# Patient Record
Sex: Female | Born: 1970 | Race: White | Hispanic: No | Marital: Single | Smoking: Current every day smoker
Health system: Southern US, Community
[De-identification: ages and names within clinical notes are randomized; demographics above are authoritative.]

## PROBLEM LIST (undated history)

## (undated) DIAGNOSIS — J45909 Unspecified asthma, uncomplicated: Secondary | ICD-10-CM

## (undated) DIAGNOSIS — I639 Cerebral infarction, unspecified: Secondary | ICD-10-CM

## (undated) DIAGNOSIS — C801 Malignant (primary) neoplasm, unspecified: Secondary | ICD-10-CM

## (undated) DIAGNOSIS — G459 Transient cerebral ischemic attack, unspecified: Secondary | ICD-10-CM

## (undated) DIAGNOSIS — R55 Syncope and collapse: Secondary | ICD-10-CM

## (undated) DIAGNOSIS — I456 Pre-excitation syndrome: Secondary | ICD-10-CM

## (undated) HISTORY — DX: Malignant (primary) neoplasm, unspecified: C80.1

## (undated) HISTORY — DX: Cerebral infarction, unspecified: I63.9

## (undated) HISTORY — DX: Transient cerebral ischemic attack, unspecified: G45.9

## (undated) HISTORY — DX: Syncope and collapse: R55

## (undated) HISTORY — DX: Pre-excitation syndrome: I45.6

## (undated) HISTORY — DX: Unspecified asthma, uncomplicated: J45.909

---
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
## 2008-03-05 ENCOUNTER — Emergency Department: Payer: Self-pay | Admitting: Emergency Medicine

## 2008-06-12 ENCOUNTER — Emergency Department: Payer: Self-pay | Admitting: Emergency Medicine

## 2010-06-28 ENCOUNTER — Emergency Department: Payer: Self-pay | Admitting: Unknown Physician Specialty

## 2010-10-03 ENCOUNTER — Emergency Department: Payer: Self-pay | Admitting: Unknown Physician Specialty

## 2010-12-05 ENCOUNTER — Emergency Department: Payer: Self-pay | Admitting: Emergency Medicine

## 2012-12-24 ENCOUNTER — Emergency Department: Payer: Self-pay | Admitting: Emergency Medicine

## 2012-12-29 ENCOUNTER — Encounter: Payer: Self-pay | Admitting: Internal Medicine

## 2012-12-29 ENCOUNTER — Emergency Department: Payer: Self-pay | Admitting: Emergency Medicine

## 2012-12-29 LAB — TROPONIN I
Troponin-I: <0.02 ng/mL
Troponin-I: <0.02 ng/mL

## 2012-12-29 LAB — CBC WITH DIFFERENTIAL/PLATELET
Basophil #: 0.1 10*3/uL (ref 0.0–0.1)
Basophil %: 1.1 %
Eosinophil #: 0.3 10*3/uL (ref 0.0–0.7)
Eosinophil %: 2 %
HCT: 39.1 % (ref 35.0–47.0)
HGB: 13.2 g/dL (ref 12.0–16.0)
Lymphocyte #: 3.2 10*3/uL (ref 1.0–3.6)
Lymphocyte %: 25 %
MCH: 28.1 pg (ref 26.0–34.0)
MCHC: 33.8 g/dL (ref 32.0–36.0)
MCV: 83 fL (ref 80–100)
Monocyte #: 0.8 x10 3/mm (ref 0.2–0.9)
Monocyte %: 6.3 %
Neutrophil #: 8.5 10*3/uL — ABNORMAL HIGH (ref 1.4–6.5)
Neutrophil %: 65.6 %
Platelet: 333 10*3/uL (ref 150–440)
RBC: 4.71 10*6/uL (ref 3.80–5.20)
RDW: 16.2 % — ABNORMAL HIGH (ref 11.5–14.5)
WBC: 12.9 10*3/uL — ABNORMAL HIGH (ref 3.6–11.0)

## 2012-12-29 LAB — TSH: Thyroid Stimulating Horm: 1.43 u[IU]/mL

## 2012-12-29 LAB — COMPREHENSIVE METABOLIC PANEL
Albumin: 3.7 g/dL (ref 3.4–5.0)
Alkaline Phosphatase: 71 U/L (ref 50–136)
Anion Gap: 5 — ABNORMAL LOW (ref 7–16)
BUN: 9 mg/dL (ref 7–18)
Bilirubin,Total: 0.4 mg/dL (ref 0.2–1.0)
Calcium, Total: 8.9 mg/dL (ref 8.5–10.1)
Chloride: 110 mmol/L — ABNORMAL HIGH (ref 98–107)
Co2: 23 mmol/L (ref 21–32)
Creatinine: 0.6 mg/dL (ref 0.60–1.30)
EGFR (African American): >60
EGFR (Non-African Amer.): >60
Glucose: 86 mg/dL (ref 65–99)
Osmolality: 274 (ref 275–301)
Potassium: 4.1 mmol/L (ref 3.5–5.1)
SGOT(AST): 31 U/L (ref 15–37)
SGPT (ALT): 19 U/L (ref 12–78)
Sodium: 138 mmol/L (ref 136–145)
Total Protein: 7.2 g/dL (ref 6.4–8.2)

## 2012-12-31 ENCOUNTER — Ambulatory Visit (INDEPENDENT_AMBULATORY_CARE_PROVIDER_SITE_OTHER): Payer: Self-pay | Admitting: Internal Medicine

## 2012-12-31 ENCOUNTER — Encounter: Payer: Self-pay | Admitting: Internal Medicine

## 2012-12-31 VITALS — BP 118/82 | HR 56 | Ht 63.0 in | Wt 140.0 lb

## 2012-12-31 DIAGNOSIS — R002 Palpitations: Secondary | ICD-10-CM

## 2012-12-31 DIAGNOSIS — I456 Pre-excitation syndrome: Secondary | ICD-10-CM

## 2012-12-31 DIAGNOSIS — R079 Chest pain, unspecified: Secondary | ICD-10-CM

## 2012-12-31 HISTORY — DX: Pre-excitation syndrome: I45.6

## 2012-12-31 NOTE — Assessment & Plan Note (Signed)
The patient has ventricular preexcitation. She has had tachycardia palpitations. Interestingly, by report, vital signs were normal on arrival of EMS raising the question as to whether this was a panic attack. She has also had subsequent episodes however,  Of tachypalpitations. We'll undertake an event recorder to try and clarify the mechanism of her palpitations. We have discussed the mechanism of WPW and SVT.  We'll obtain an echo because of right-sided pathways can be associated with Epstein's anomaly.

## 2012-12-31 NOTE — Progress Notes (Signed)
ELECTROPHYSIOLOGY CONSULT NOTE  Patient ID: Alexandra Stevenson, MRN: [id number], DOB/AGE: Aug 21, 1970 42 y.o. Admit date: (Not on file) Date of Consult: 12/31/2012  Primary Physician: No PCP Per Patient Primary Cardiologist:   Chief Complaint:    HPI Alexandra Stevenson is a 42 y.o. female seen at the request of the emergency room with a past medical history notable for question strokes who presented to the ER the EMS because of symptoms of palpitations associated with chest pain lightheadedness diaphoresis and flushing. She ruled  out for myocardial infarction. Her ECG was notable for ventricular preexcitation and she was referred. She has had no prior episodes. However, since that time 48 hours ago she has had a number of episodes with abrupt onset and offset of palpitations associated with some lightheadedness and she's also had some vague chest discomfort.  The information related to her prior stroke history is not available.  She describes herself as a recovering addict. She uses alcohol rarely; she smokes uses no recreational drugs. She is the mother of 4. Her children live in [place]. She does see them. Past Medical History  Diagnosis Date  . TIA (transient ischemic attack)     x 3  . Asthma   . Syncope and collapse   . Cancer 1990    cervical  . Stroke     x3      Surgical History:  Past Surgical History  Procedure Laterality Date  . Cervical biopsy  1990     Home Meds: Prior to Admission medications   Not on File     Allergies: No Known Allergies  History   Social History  . Marital Status: Single    Spouse Name: N/A    Number of Children: N/A  . Years of Education: N/A   Occupational History  . Not on file.   Social History Main Topics  . Smoking status: Current Every Day Smoker -- 0.50 packs/day for 30 years    Types: Cigarettes  . Smokeless tobacco: Not on file  . Alcohol Use: Yes     Comment: occasional  . Drug Use: No     Comment: past  . Sexually  Active: Not on file   Other Topics Concern  . Not on file   Social History Narrative  . No narrative on file     History reviewed. No pertinent family history.   ROS:  Please see the history of present illness.   f  All other systems reviewed and negative.    Physical Exam:   Blood pressure 118/82, pulse 56, height 5\' 3"  (1.6 m), weight 140 lb (63.504 kg). General: Well developed, well nourished female in no acute distress. Head: Normocephalic, atraumatic, sclera non-icteric, no xanthomas, nares are without discharge. EENT:poor dentition Lymph Nodes:  none Back: without scoliosis/kyphosis , no CVA tendersness Neck: Negative for carotid bruits. JVD not elevated. Lungs: Clear bilaterally to auscultation without wheezes, rales, or rhonchi. Breathing is unlabored. Heart: RRR with quiet  Call homeS1 S2. No  murmur , rubs, or gallops appreciated. Abdomen: Soft, non-tender, non-distended with normoactive bowel sounds. No hepatomegaly. No rebound/guarding. No obvious abdominal masses. Msk:  Strength and tone appear normal for age. Extremities: No clubbing or cyanosis. N  edema.  Distal pedal pulses are 2+ and equal bilaterally. Skin: Warm and Dry broadly tatooed Neuro: Alert and oriented X 3. CN III-XII intact Grossly normal sensory and motor function . Psych:  Responds to questions appropriately with a normal affect.  Labs:  Radiology/Studies:  No results found.  EKG: sinus rhythm with evidence of ventricular preexcitation heart rate was 56 Delta waves were positive with a transition V2-V3 most consistent with the right lateral accessory pathway  Assessment and Plan:    Sherryl Manges

## 2012-12-31 NOTE — Patient Instructions (Addendum)
Your physician has requested that you have an echocardiogram. Echocardiography is a painless test that uses sound waves to create images of your heart. It provides your doctor with information about the size and shape of your heart and how well your heart's chambers and valves are working. This procedure takes approximately one hour. There are no restrictions for this procedure.  Your physician has recommended that you wear an event monitor. Event monitors are medical devices that record the heart's electrical activity. Doctors most often [place] these monitors to diagnose arrhythmias. Arrhythmias are problems with the speed or rhythm of the heartbeat. The monitor is a small, portable device. You can wear one while you do your normal daily activities. This is usually used to diagnose what is causing palpitations/syncope (passing out).  Your physician recommends that you schedule a follow-up appointment in: 4-5 weeks with Dr. Graciela Husbands.

## 2013-01-08 ENCOUNTER — Other Ambulatory Visit (INDEPENDENT_AMBULATORY_CARE_PROVIDER_SITE_OTHER): Payer: Self-pay

## 2013-01-08 ENCOUNTER — Other Ambulatory Visit: Payer: Self-pay

## 2013-01-08 DIAGNOSIS — R002 Palpitations: Secondary | ICD-10-CM

## 2013-01-08 DIAGNOSIS — I456 Pre-excitation syndrome: Secondary | ICD-10-CM

## 2013-01-13 DIAGNOSIS — R002 Palpitations: Secondary | ICD-10-CM

## 2013-01-13 DIAGNOSIS — I456 Pre-excitation syndrome: Secondary | ICD-10-CM

## 2013-02-10 ENCOUNTER — Telehealth: Payer: Self-pay

## 2013-02-10 NOTE — Telephone Encounter (Signed)
Please advise ecardio tracings on your desk for review

## 2013-02-10 NOTE — Telephone Encounter (Signed)
Pt calling with issues ZO:[id number] monitor  Says the wires were "messing up" etc so she had to call company several times to get new wires/battery shipped to her Has not worn monitor since 6/22. Last day for monitor is tomm She asks if ok to go ahead and send this back, asks if we have enough info After reviewing monitor it appears we have enough but i will check with Dr. Kirke Corin first Says she is still having occasional CP

## 2013-02-11 NOTE — Telephone Encounter (Signed)
Please advise Can fax tracings if I need to

## 2013-02-11 NOTE — Telephone Encounter (Signed)
A patient of Dr. Graciela Husbands.

## 2013-02-12 ENCOUNTER — Observation Stay: Payer: Self-pay | Admitting: Family Medicine

## 2013-02-12 LAB — CK TOTAL AND CKMB (NOT AT ARMC)
CK, Total: 129 U/L (ref 21–215)
CK, Total: 86 U/L (ref 21–215)
CK-MB: 1.5 ng/mL (ref 0.5–3.6)
CK-MB: 1.6 ng/mL (ref 0.5–3.6)

## 2013-02-12 LAB — URINALYSIS, COMPLETE
Bacteria: NONE SEEN
Bilirubin,UR: NEGATIVE
Glucose,UR: NEGATIVE mg/dL (ref 0–75)
Ketone: NEGATIVE
Leukocyte Esterase: NEGATIVE
Nitrite: NEGATIVE
Ph: 7 (ref 4.5–8.0)
Protein: NEGATIVE
RBC,UR: 1 /HPF (ref 0–5)
Specific Gravity: 1.005 (ref 1.003–1.030)
Squamous Epithelial: 1
WBC UR: <1 /HPF (ref 0–5)

## 2013-02-12 LAB — COMPREHENSIVE METABOLIC PANEL
Albumin: 3.9 g/dL (ref 3.4–5.0)
Alkaline Phosphatase: 75 U/L (ref 50–136)
Anion Gap: 14 (ref 7–16)
BUN: 4 mg/dL — ABNORMAL LOW (ref 7–18)
Bilirubin,Total: 0.5 mg/dL (ref 0.2–1.0)
Calcium, Total: 9 mg/dL (ref 8.5–10.1)
Chloride: 111 mmol/L — ABNORMAL HIGH (ref 98–107)
Co2: 18 mmol/L — ABNORMAL LOW (ref 21–32)
Creatinine: 0.71 mg/dL (ref 0.60–1.30)
EGFR (African American): >60
EGFR (Non-African Amer.): >60
Glucose: 87 mg/dL (ref 65–99)
Osmolality: 281 (ref 275–301)
Potassium: 3.4 mmol/L — ABNORMAL LOW (ref 3.5–5.1)
SGOT(AST): 17 U/L (ref 15–37)
SGPT (ALT): 20 U/L (ref 12–78)
Sodium: 143 mmol/L (ref 136–145)
Total Protein: 7.2 g/dL (ref 6.4–8.2)

## 2013-02-12 LAB — ETHANOL
Ethanol %: 0.101 % — ABNORMAL HIGH (ref 0.000–0.080)
Ethanol: 101 mg/dL

## 2013-02-12 LAB — TROPONIN I
Troponin-I: <0.02 ng/mL
Troponin-I: <0.02 ng/mL

## 2013-02-12 LAB — CBC
HCT: 37.9 % (ref 35.0–47.0)
HGB: 12.7 g/dL (ref 12.0–16.0)
MCH: 27.9 pg (ref 26.0–34.0)
MCHC: 33.5 g/dL (ref 32.0–36.0)
MCV: 83 fL (ref 80–100)
Platelet: 357 10*3/uL (ref 150–440)
RBC: 4.55 10*6/uL (ref 3.80–5.20)
RDW: 15.8 % — ABNORMAL HIGH (ref 11.5–14.5)
WBC: 24.2 10*3/uL — ABNORMAL HIGH (ref 3.6–11.0)

## 2013-02-12 LAB — PROTIME-INR
INR: 1
Prothrombin Time: 13.2 secs (ref 11.5–14.7)

## 2013-02-12 LAB — DRUG SCREEN, URINE
Amphetamines, Ur Screen: NEGATIVE (ref ?–1000)
Barbiturates, Ur Screen: NEGATIVE (ref ?–200)
Benzodiazepine, Ur Scrn: NEGATIVE (ref ?–200)
Cannabinoid 50 Ng, Ur ~~LOC~~: POSITIVE (ref ?–50)
Cocaine Metabolite,Ur ~~LOC~~: POSITIVE (ref ?–300)
MDMA (Ecstasy)Ur Screen: NEGATIVE (ref ?–500)
Methadone, Ur Screen: NEGATIVE (ref ?–300)
Opiate, Ur Screen: NEGATIVE (ref ?–300)
Phencyclidine (PCP) Ur S: NEGATIVE (ref ?–25)
Tricyclic, Ur Screen: NEGATIVE (ref ?–1000)

## 2013-02-12 LAB — PHOSPHORUS: Phosphorus: 1.9 mg/dL — ABNORMAL LOW (ref 2.5–4.9)

## 2013-02-12 LAB — PREGNANCY, URINE: Pregnancy Test, Urine: NEGATIVE m[IU]/mL

## 2013-02-12 LAB — MAGNESIUM: Magnesium: 1.8 mg/dL

## 2013-02-13 LAB — HCG, QUANTITATIVE, PREGNANCY: Beta Hcg, Quant.: <1 m[IU]/mL — ABNORMAL LOW

## 2013-02-13 LAB — LIPID PANEL
Cholesterol: 81 mg/dL (ref 0–200)
HDL Cholesterol: 31 mg/dL — ABNORMAL LOW (ref 40–60)
Ldl Cholesterol, Calc: 35 mg/dL (ref 0–100)
Triglycerides: 73 mg/dL (ref 0–200)
VLDL Cholesterol, Calc: 15 mg/dL (ref 5–40)

## 2013-02-13 LAB — TROPONIN I: Troponin-I: <0.02 ng/mL

## 2013-02-13 LAB — CK TOTAL AND CKMB (NOT AT ARMC)
CK, Total: 75 U/L (ref 21–215)
CK-MB: 1.1 ng/mL (ref 0.5–3.6)

## 2013-02-22 ENCOUNTER — Ambulatory Visit (INDEPENDENT_AMBULATORY_CARE_PROVIDER_SITE_OTHER): Payer: Self-pay | Admitting: Internal Medicine

## 2013-02-22 ENCOUNTER — Encounter: Payer: Self-pay | Admitting: Internal Medicine

## 2013-02-22 VITALS — BP 122/88 | HR 64 | Ht 63.0 in | Wt 138.0 lb

## 2013-02-22 DIAGNOSIS — R079 Chest pain, unspecified: Secondary | ICD-10-CM

## 2013-02-22 DIAGNOSIS — R5381 Other malaise: Secondary | ICD-10-CM

## 2013-02-22 DIAGNOSIS — D72829 Elevated white blood cell count, unspecified: Secondary | ICD-10-CM

## 2013-02-22 DIAGNOSIS — R5383 Other fatigue: Secondary | ICD-10-CM

## 2013-02-22 DIAGNOSIS — I456 Pre-excitation syndrome: Secondary | ICD-10-CM

## 2013-02-22 NOTE — Assessment & Plan Note (Signed)
I am not sure of the cause of this. Reviewing her laboratories demonstrates a low phosphorus low potassium a markedly elevated white blood count. Recheck the latter with a differential

## 2013-02-22 NOTE — Assessment & Plan Note (Signed)
Asymptomatic preexcitation; we will not pursue further treatment at this time. She is advised that is she has tachypalpitations to seek medical attention and i would be glad to see her after that. I explained that it is not contributing to her fatigue

## 2013-02-22 NOTE — Progress Notes (Signed)
Patient Care Team: No Pcp Per Patient as PCP - General (General Practice)   HPI  Alexandra Stevenson is a 42 y.o. female Seen in followup for WPW with some palpitations. Echocardiogram demonstrated normal left ventricular function and no AV valvular issues. She's recently been hospitalized for chest pain associated with cocaine and other substances.  She had while wearing her 30 day monitor multiple episodes of flutter. No arrhythmia was noted.  Her other major complaint is fatigue. Outside hospital records were reviewed. Lipids were normal UA was normal white count was markedly elevated ; her potassium was somewhat low as was her phosphorus. CT scan was normal for pulmonary embolism.  Past Medical History  Diagnosis Date  . TIA (transient ischemic attack)     x 3  . Asthma   . Syncope and collapse   . Cancer 1990    cervical  . Stroke     x3  . WPW (Wolff-Parkinson-White syndrome) 12/31/2012    Past Surgical History  Procedure Laterality Date  . Cervical biopsy  1990    Current Outpatient Prescriptions  Medication Sig Dispense Refill  . ibuprofen (ADVIL,MOTRIN) 200 MG tablet Take 200 mg by mouth every 6 (six) hours as needed for pain.       No current facility-administered medications for this visit.    Allergies  Allergen Reactions  . No Known Allergies     Review of Systems negative except from HPI and PMH  Physical Exam BP 122/88  Pulse 64  Ht 5\' 3"  (1.6 m)  Wt 138 lb (62.596 kg)  BMI 24.45 kg/m2 Well developed and well nourished in no acute distress HENT normal E scleral and icterus clear Neck Supple JVP flat; carotids brisk and full Clear to ausculation Regular rate and rhythm, no murmurs gallops or rub Soft with active bowel sounds No clubbing cyanosis none Edema Alert and oriented, grossly normal motor and sensory function Skin Warm and Dry    Assessment and  Plan

## 2013-02-22 NOTE — Patient Instructions (Addendum)
Your physician wants you to follow-up in: 6 months You will receive a reminder letter in the mail two months in advance. If you don't receive a letter, please call our office to schedule the follow-up appointment.  Labs today CBC and BMET  Your physician has requested that you have a lexiscan myoview. For further information please visit [email or web address] Please follow instruction sheet, as given.  [facility] MYOVIEW  Your caregiver has ordered a Stress Test with nuclear imaging. The purpose of this test is to evaluate the blood supply to your heart muscle. This procedure is referred to as a "Non-Invasive Stress Test." This is because other than having an IV started in your vein, nothing is inserted or "invades" your body. Cardiac stress tests are done to find areas of poor blood flow to the heart by determining the extent of coronary artery disease (CAD). Some patients exercise on a treadmill, which naturally increases the blood flow to your heart, while others who are  unable to walk on a treadmill due to physical limitations have a pharmacologic/chemical stress agent called Lexiscan . This medicine will mimic walking on a treadmill by temporarily increasing your coronary blood flow.   Please note: these test may take anywhere between 2-4 hours to complete  PLEASE REPORT TO [facility]  THE VOLUNTEERS AT THE FIRST DESK WILL DIRECT YOU WHERE TO GO  Date of Procedure:____________Monday July 21st_______________  Arrival Time for Procedure:____________9:30am _____________  PLEASE NOTIFY THE OFFICE AT LEAST 24 HOURS IN ADVANCE IF YOU ARE UNABLE TO KEEP YOUR APPOINTMENT.  [phone]  How to prepare for your Myoview test:  1. Do not eat or drink after midnight 2. No caffeine for 24 hours prior to test 3. No smoking 24 hours prior to test. 4. Your medication may be taken with water.  If your doctor stopped a medication because of this test, do not take that medication. 5. Ladies,  please do not wear dresses.  Skirts or pants are appropriate. Please wear a short sleeve shirt. 6. No perfume, cologne or lotion. 7. Wear comfortable walking shoes. No heels!

## 2013-02-22 NOTE — Assessment & Plan Note (Signed)
As above.

## 2013-02-23 LAB — BASIC METABOLIC PANEL
BUN/Creatinine Ratio: 9 (ref 9–23)
BUN: 7 mg/dL (ref 6–24)
CO2: 18 mmol/L (ref 18–29)
Calcium: 9.3 mg/dL (ref 8.7–10.2)
Chloride: 104 mmol/L (ref 97–108)
Creatinine, Ser: 0.8 mg/dL (ref 0.57–1.00)
GFR calc Af Amer: 104 mL/min/{1.73_m2} (ref >59)
GFR calc non Af Amer: 91 mL/min/{1.73_m2} (ref >59)
Glucose: 87 mg/dL (ref 65–99)
Potassium: 4.6 mmol/L (ref 3.5–5.2)
Sodium: 142 mmol/L (ref 134–144)

## 2013-02-23 LAB — CBC WITH DIFFERENTIAL/PLATELET
Basophils Absolute: 0.1 10*3/uL (ref 0.0–0.2)
Basos: 1 % (ref 0–3)
Eos: 1 % (ref 0–5)
Eosinophils Absolute: 0.2 10*3/uL (ref 0.0–0.4)
HCT: 39 % (ref 34.0–46.6)
Hemoglobin: 12.9 g/dL (ref 11.1–15.9)
Immature Grans (Abs): 0 10*3/uL (ref 0.0–0.1)
Immature Granulocytes: 0 % (ref 0–2)
Lymphocytes Absolute: 3.3 10*3/uL — ABNORMAL HIGH (ref 0.7–3.1)
Lymphs: 21 % (ref 14–46)
MCH: 28.5 pg (ref 26.6–33.0)
MCHC: 33.1 g/dL (ref 31.5–35.7)
MCV: 86 fL (ref 79–97)
Monocytes Absolute: 1.1 10*3/uL — ABNORMAL HIGH (ref 0.1–0.9)
Monocytes: 7 % (ref 4–12)
Neutrophils Absolute: 10.7 10*3/uL — ABNORMAL HIGH (ref 1.4–7.0)
Neutrophils Relative %: 70 % (ref 40–74)
RBC: 4.52 x10E6/uL (ref 3.77–5.28)
RDW: 15.7 % — ABNORMAL HIGH (ref 12.3–15.4)
WBC: 15.3 10*3/uL — ABNORMAL HIGH (ref 3.4–10.8)

## 2013-03-01 ENCOUNTER — Ambulatory Visit: Payer: Self-pay | Admitting: Internal Medicine

## 2013-03-01 ENCOUNTER — Telehealth: Payer: Self-pay | Admitting: *Deleted

## 2013-03-01 DIAGNOSIS — R079 Chest pain, unspecified: Secondary | ICD-10-CM

## 2013-03-01 NOTE — Telephone Encounter (Signed)
Tresa Endo called re: Has the [employer] paperwork for sick leave due to hospital stay and appointment follow up appt.'s been completed?

## 2013-03-02 ENCOUNTER — Other Ambulatory Visit: Payer: Self-pay

## 2013-03-02 DIAGNOSIS — I456 Pre-excitation syndrome: Secondary | ICD-10-CM

## 2013-03-02 DIAGNOSIS — R5383 Other fatigue: Secondary | ICD-10-CM

## 2013-03-02 DIAGNOSIS — R079 Chest pain, unspecified: Secondary | ICD-10-CM

## 2013-03-03 ENCOUNTER — Encounter (INDEPENDENT_AMBULATORY_CARE_PROVIDER_SITE_OTHER): Payer: Self-pay

## 2013-03-03 DIAGNOSIS — I456 Pre-excitation syndrome: Secondary | ICD-10-CM

## 2013-03-03 DIAGNOSIS — R002 Palpitations: Secondary | ICD-10-CM

## 2013-03-03 NOTE — Telephone Encounter (Signed)
Please call [employer] and inquire about pt's FMLA.  Thanks

## 2014-02-09 NOTE — Telephone Encounter (Signed)
This encounter was created in error - please disregard.

## 2014-04-18 ENCOUNTER — Emergency Department: Payer: Self-pay | Admitting: Emergency Medicine

## 2014-04-18 LAB — CBC WITH DIFFERENTIAL/PLATELET
Basophil #: 0.2 10*3/uL — ABNORMAL HIGH (ref 0.0–0.1)
Basophil %: 1.5 %
Eosinophil #: 0.3 10*3/uL (ref 0.0–0.7)
Eosinophil %: 2.3 %
HCT: 35.2 % (ref 35.0–47.0)
HGB: 11 g/dL — ABNORMAL LOW (ref 12.0–16.0)
Lymphocyte #: 3.3 10*3/uL (ref 1.0–3.6)
Lymphocyte %: 25.4 %
MCH: 25.2 pg — ABNORMAL LOW (ref 26.0–34.0)
MCHC: 31.4 g/dL — ABNORMAL LOW (ref 32.0–36.0)
MCV: 81 fL (ref 80–100)
Monocyte #: 0.9 x10 3/mm (ref 0.2–0.9)
Monocyte %: 6.9 %
Neutrophil #: 8.2 10*3/uL — ABNORMAL HIGH (ref 1.4–6.5)
Neutrophil %: 63.9 %
Platelet: 410 10*3/uL (ref 150–440)
RBC: 4.37 10*6/uL (ref 3.80–5.20)
RDW: 18.1 % — ABNORMAL HIGH (ref 11.5–14.5)
WBC: 12.9 10*3/uL — ABNORMAL HIGH (ref 3.6–11.0)

## 2014-04-18 LAB — URINALYSIS, COMPLETE
Bilirubin,UR: NEGATIVE
Glucose,UR: NEGATIVE mg/dL (ref 0–75)
Ketone: NEGATIVE
Leukocyte Esterase: NEGATIVE
Nitrite: NEGATIVE
Ph: 5 (ref 4.5–8.0)
Protein: NEGATIVE
RBC,UR: 99 /HPF (ref 0–5)
Specific Gravity: 1.018 (ref 1.003–1.030)
Squamous Epithelial: 2
WBC UR: 3 /HPF (ref 0–5)

## 2014-04-18 LAB — COMPREHENSIVE METABOLIC PANEL
Albumin: 3.5 g/dL (ref 3.4–5.0)
Alkaline Phosphatase: 61 U/L
Anion Gap: 6 — ABNORMAL LOW (ref 7–16)
BUN: 6 mg/dL — ABNORMAL LOW (ref 7–18)
Bilirubin,Total: 0.2 mg/dL (ref 0.2–1.0)
Calcium, Total: 8.5 mg/dL (ref 8.5–10.1)
Chloride: 111 mmol/L — ABNORMAL HIGH (ref 98–107)
Co2: 22 mmol/L (ref 21–32)
Creatinine: 0.97 mg/dL (ref 0.60–1.30)
EGFR (African American): >60
EGFR (Non-African Amer.): >60
Glucose: 103 mg/dL — ABNORMAL HIGH (ref 65–99)
Osmolality: 275 (ref 275–301)
Potassium: 3.5 mmol/L (ref 3.5–5.1)
SGOT(AST): 13 U/L — ABNORMAL LOW (ref 15–37)
SGPT (ALT): 13 U/L — ABNORMAL LOW
Sodium: 139 mmol/L (ref 136–145)
Total Protein: 7 g/dL (ref 6.4–8.2)

## 2014-04-18 LAB — LIPASE, BLOOD: Lipase: 190 U/L (ref 73–393)

## 2014-04-18 LAB — GC/CHLAMYDIA PROBE AMP

## 2014-04-18 LAB — WET PREP, GENITAL

## 2014-05-02 ENCOUNTER — Telehealth: Payer: Self-pay | Admitting: Family Medicine

## 2014-05-02 NOTE — Telephone Encounter (Signed)
Error

## 2014-12-02 NOTE — Discharge Summary (Signed)
PATIENT NAME:  Alexandra Stevenson, Alexandra Stevenson MR#:  [id number] DATE OF BIRTH:  15-Mar-1971  DATE OF ADMISSION:  02/12/2013 DATE OF DISCHARGE:  02/13/2013  REASON FOR ADMISSION: Chest pain.  DISPOSITION: Home.  MEDICATIONS AT DISCHARGE: Aspirin 325 mg once daily.  FOLLOWUP: Dr. Ida Stevenson in the next 2 to 4 days for a stress test in the office.  DIET:  Low-fat, low-cholesterol.  RECOMMENDATIONS: Stop using cocaine. Take aspirin. Healthy diet and exercise.  IMPORTANT RESULTS:  Potassium 3.4, chloride 111, creatinine 0.71, LCL cholesterol 35, HDL cholesterol 31, alcohol level 0.01.   Troponins were negative x 3. Urinalysis positive for cocaine.  White count of 24,000. D-dimer was negative. Urinalysis was negative. Pregnancy test was negative.   EKG: Normal sinus rhythm.   Chest x-ray: No acute disease of the chest.  CT of the chest to rule out pulmonary embolism:  No evidence of pulmonary embolus. No focal consolidation or pneumothorax.   HOSPITAL COURSE: Alexandra Stevenson is  a very nice 44 year old female who has a history of a previous CVA, Wolff-Parkinson-White syndrome and tobacco abuse as well as drug abuse.  She presented to the ER on 02/12/2013 with chest pain that was sharp in nature, with feeling in the central chest. No radiation, not associated with shortness of breath, diaphoresis, palpitations, or dizziness. She developed symptoms around 6:30 in the morning when she woke up, and she had another episode similar to this 3 months ago.  The patient presented to the ER where she was already pain-free. She did have a history of tobacco abuse. Urine toxicology was positive for cocaine. The patient was admitted for further evaluation.  MEDICAL PROBLEMS: 1.  Chest pain: Likely related to cocaine abuse. The pain was actually reproducible as well during my physical examination. The patient was free of pain, and she had no changes on her EKG. Her troponins were negative. She does have a history of  Wolff-Parkinson-White. There were no signs of supraventricular tachycardia at that point. There were no palpitations, and without any tachyarrhythmias on telemetry monitoring.  The patient was scheduled to have a  Myoview in the morning. She is still cocaine-positive. I spoke with Dr. Rockey Situ. He recommended that she go to the office and get an exercise stress test there as the patient was asymptomatic, with fairly low risk factors.  Recommendation to quit cocaine were given to the patient. Tobacco cessation was given to the patient. As far as her Wolff-Parkinson-White, the patient is to follow up with the [facility], and so far are no signs of excitability of the heart,  no arrhythmias.  The patient agreed with the plan. She says she has not done cocaine in weeks, but as the test was positive we encouraged her to not use cocaine anymore.  I spent about 45 minutes discharging this patient and discussing the case with cardiology.   ____________________________ Lemitar Sink, MD rsg:dm D: 02/14/2013 06:58:20 ET T: 02/14/2013 07:27:13 ET JOB#: [id number]  cc: [place] Sink, MD, <Dictator> Minna Merritts, MD Cristi Loron MD ELECTRONICALLY SIGNED 02/20/2013 16:37

## 2014-12-02 NOTE — H&P (Signed)
PATIENT NAME:  Alexandra Stevenson, Alexandra Stevenson MR#:  [id number] DATE OF BIRTH:  [birth year]  DATE OF ADMISSION:  02/12/2013  PRIMARY CARE PHYSICIAN: Does not have one.  CHIEF COMPLAINT: Chest pain.   HISTORY OF PRESENT ILLNESS: This is a 44 year old female who presents to the hospital due to chest pain. She describes the pain as being sharp in nature, located in the center of her chest, nonradiating, not associated with shortness of breath or diaphoresis or palpitations or any dizziness. She developed the symptoms around 6:30 this morning as it woke her up from sleep. She had another episode of this, similar chest pain, about 3 months ago.   Presently, the patient is chest pain-free and hemodynamically stable. The patient does have a history of long-term tobacco abuse. Also her urine toxicology was positive for cocaine. Hospitalist services were contacted for further treatment and evaluation.   REVIEW OF SYSTEMS: CONSTITUTIONAL: No documented fever. No weight gain or weight loss.  EYES: No blurry or double vision.  ENT: No tinnitus. No postnasal drip. No redness of the oropharynx.  RESPIRATORY: No cough. No wheeze. No hemoptysis.  CARDIOVASCULAR: Positive chest pain. No orthopnea. No palpitations or syncope.  GASTROINTESTINAL: No nausea. No vomiting. No diarrhea. No abdominal pain. No melena or hematochezia.  GENITOURINARY: No dysuria or hematuria.  ENDOCRINE: No polyuria or nocturia. No heat or cold intolerance. HEME:  No anemia. No bruising or bleeding.  INTEGUMENTARY: No rashes. No lesions.  MUSCULOSKELETAL: No arthritis. No swelling. No gout.  NEUROLOGIC: No numbness or tingling. No ataxia. No seizure-type activity.  PSYCHIATRIC: No anxiety.  No insomnia. No ADD.   PAST MEDICAL HISTORY: Is consistent with: 1.  Previous CVA. 2.  Wolff-Parkinson-White syndrome. 3.  Tobacco abuse.   ALLERGIES: No known drug allergies.   SOCIAL HISTORY: Does smoke about 1/2 pack per day, has been smoking for the past 30  years. Occasional alcohol abuse.  Urine toxicology is positive for marijuana and cocaine. She said she last did cocaine about a week or so ago.   FAMILY HISTORY: The patient's mother died from complications of a brain aneurysm. Father is alive, has no health problems.   CURRENT MEDICATIONS: The patient is currently on no medications.   PHYSICAL EXAMINATION:  VITAL SIGNS: Temperature is 98.1, pulse 78, respirations 20, blood pressure 129/62 and sats 98% on room air.  GENERAL: She is a pleasant-appearing female in no apparent distress.  HEENT: Atraumatic, normocephalic. Extraocular muscles were intact. Pupils equal and reactive to light. Sclerae anicteric. No conjunctival injection. No pharyngeal erythema.  NECK: Supple. There is no jugular venous distention. No bruits, no lymphadenopathy and no thyromegaly.  HEART: Regular rate and rhythm. No murmurs, no rubs and no clicks.  LUNGS: Clear to auscultation bilaterally. No rales, no rhonchi and no wheezes.  ABDOMEN: Soft, flat, nontender, nondistended. Has good bowel sounds. No hepatosplenomegaly appreciated.  EXTREMITIES: No evidence of any cyanosis, clubbing or peripheral edema. Has +2 pedal and radial pulses bilaterally.  NEUROLOGIC: The patient is alert, awake and oriented x 3 with no focal motor or sensory deficits appreciated bilaterally.  SKIN: Moist and warm with no rash appreciated.  LYMPHATIC: There is no cervical or axillary adenopathy.   LABORATORY AND DIAGNOSTICS: Showed a serum glucose of 87, BUN 4, creatinine 0.7, sodium 143, potassium 3.4, chloride 111 and bicarb 18. The patient's alcohol level is 0.1. LFTs are within normal limits. Troponin is less than 0.02. Urine toxicology is positive for cocaine and marijuana. White cell count is 24.2, hemoglobin 12.7,  hematocrit 37.9 and platelet count 57. INR is 1.0. Urinalysis is within normal limits.   The patient did have an EKG done which shows normal sinus rhythm with a WPW pattern.    ASSESSMENT AND PLAN: This is a 44 year old female with history of previous cerebrovascular accident, tobacco abuse, cocaine abuse and history of Wolff-Parkinson-White syndrome who presents to the hospital due to chest pain.  1.  Chest pain.  The patient does have significant risk factors given her long history of tobacco abuse and also recent cocaine abuse. Her symptoms are typical for angina, but have now resolved. EKG does show a Wolff-Parkinson-White syndrome pattern but no other acute ST changes. I will observe her overnight on telemetry, follow serial cardiac markers, continue aspirin, nitroglycerin and oxygen. I will get a Myoview in the morning.  2.  Tobacco abuse. I counseled the patient on quitting smoking, between 1 to 3 minutes. The patient refused a nicotine patch. 3.  Wolff-Parkinson-White syndrome.  The patient is currently rate controlled, hemodynamically stable. Continue follow-up with cardiology as an outpatient. The patient follows up with  [facility].   CODE STATUS:  The patient is a FULL CODE.   TIME SPENT WITH ADMISSION: 45 minutes. ____________________________ Belia Heman. Verdell Carmine, MD vjs:sb D: 02/12/2013 53:29:92 ET T: 02/12/2013 14:05:40 ET JOB#: [id number]  cc: Belia Heman. Verdell Carmine, MD, <Dictator> Henreitta Leber MD ELECTRONICALLY SIGNED 02/12/2013 15:08

## 2016-01-14 ENCOUNTER — Inpatient Hospital Stay
Admit: 2016-01-14 | Discharge: 2016-01-14 | Disposition: A | Discharge status: Home / Self Care | Payer: PRIVATE HEALTH INSURANCE | Attending: Emergency Medicine

## 2016-01-14 DIAGNOSIS — H60501 Unspecified acute noninfective otitis externa, right ear: Secondary | ICD-10-CM

## 2016-01-14 MED ORDER — NEOMYCIN-POLYMYXIN-HC 3.5 MG-10,000 UNIT/ML-1 % EAR DROPS, SUSP
3.5-10000-1 mg/mL-unit/mL-% | Freq: Four times a day (QID) | OTIC | 0 refills | Status: AC
Start: 2016-01-14 — End: ?

## 2016-01-14 NOTE — ED Provider Notes (Signed)
HPI Comments: 45 yo F c/o right ear pain x 3 days. Has had some drainage. Denies fever, cough, n/v. No other complaints.     Patient is a 45 y.o. female presenting with ear pain.   Ear Pain           Past Medical History:   Diagnosis Date   ??? Asthma    ??? Cancer (HCC)     cervical   ??? Ill-defined condition    ??? Stroke [facility]) 2003       Past Surgical History:   Procedure Laterality Date   ??? ABDOMEN SURGERY PROC UNLISTED      cervical cancer         History reviewed. No pertinent family history.    Social History     Social History   ??? Marital status: MARRIED     Spouse name: N/A   ??? Number of children: N/A   ??? Years of education: N/A     Occupational History   ??? Not on file.     Social History Main Topics   ??? Smoking status: Current Every Day Smoker     Packs/day: 1.00   ??? Smokeless tobacco: Not on file   ??? Alcohol use Yes      Comment: occassionally   ??? Drug use: No   ??? Sexual activity: Yes     Other Topics Concern   ??? Not on file     Social History Narrative   ??? No narrative on file         ALLERGIES: Review of patient's allergies indicates no known allergies.    Review of Systems   HENT: Positive for ear pain.    All other systems reviewed and are negative.      Vitals:    01/14/16 0053   BP: 128/85   Pulse: 97   Resp: 19   Temp: 98.3 ??F (36.8 ??C)   SpO2: 97%   Weight: 68.7 kg (151 lb 8 oz)   Height: 5' 5.5" (1.664 m)            Physical Exam   Constitutional: She is oriented to person, place, and time. She appears well-developed and well-nourished. No distress.   HENT:   Head: Normocephalic and atraumatic.   Right Ear: Tympanic membrane normal.   Left Ear: Tympanic membrane and ear canal normal.   Mouth/Throat: Uvula is midline, oropharynx is clear and moist and mucous membranes are normal.   Right canal with erythema and abrasion with small amt of blood   Eyes: Conjunctivae are normal.   Neck: Normal range of motion. Neck supple.   Cardiovascular: Normal rate, regular rhythm and normal heart sounds.     Pulmonary/Chest: Effort normal and breath sounds normal. No respiratory distress. She has no wheezes. She has no rales.   Musculoskeletal: Normal range of motion.   Neurological: She is alert and oriented to person, place, and time.   Skin: Skin is warm and dry.   Psychiatric: She has a normal mood and affect. Her behavior is normal. Judgment and thought content normal.   Nursing note and vitals reviewed.       MDM  Number of Diagnoses or Management Options  Acute otitis externa of right ear, unspecified type:     ED Course       Procedures    -------------------------------------------------------------------------------------------------------------------     EKG INTERPRETATIONS:      RADIOLOGY RESULTS:   No orders to display  LABORATORY RESULTS:  No results found for this or any previous visit (from the past 12 hour(s)).        CONSULTATIONS:        PROGRESS NOTES:    1:32 AM Pt well appearing and in NAD. Here with obvious OE. Will treat with abx drops.     Lengthy D/W pt regarding possible worsening of pt's condition, need for follow up and strict ED return instructions for any worsening symptoms.     DISPOSITION:  ED DIAGNOSIS & DISPOSITION INFORMATION  Diagnosis:   1. Acute otitis externa of right ear, unspecified type          Disposition: home    Follow-up Information     Follow up With Details Comments Contact Info    [facility]  As needed [address]  [place] [id number]  [phone]    [facility]  Immediately if symptoms worsen [address]  [place] [id number]  [phone]          Patient's Medications   Start Taking    NEOMYCIN-POLYMYXIN-HYDROCORTISONE, BUFFERED, (PEDIOTIC) 3.5-10,000-1 MG/ML-UNIT/ML-% OTIC SUSPENSION    Administer 3 Drops in right ear four (4) times daily.   Continue Taking    CYCLOBENZAPRINE (FLEXERIL) 5 MG TABLET    Take 5 mg by mouth three (3) times daily as needed for Muscle Spasm(s).   These Medications have changed     No medications on file   Stop Taking    No medications on file

## 2016-01-14 NOTE — ED Notes (Signed)
Patient armband removed and shredded  I have reviewed discharge instructions with the patient.  The patient verbalized understanding.

## 2016-01-14 NOTE — ED Triage Notes (Signed)
Patient complains of right ear pain that started Wednesday and getting worse.  Patient states that it has gone to the left side now as well.  States that he also compains of left ear pain.

## 2017-06-16 ENCOUNTER — Other Ambulatory Visit: Payer: Self-pay

## 2017-06-16 ENCOUNTER — Encounter: Payer: Self-pay | Admitting: Emergency Medicine

## 2017-06-16 ENCOUNTER — Emergency Department
Admission: EM | Admit: 2017-06-16 | Discharge: 2017-06-17 | Disposition: A | Discharge status: Home / Self Care | Payer: Self-pay | Attending: Emergency Medicine | Admitting: Emergency Medicine

## 2017-06-16 ENCOUNTER — Emergency Department: Payer: Self-pay

## 2017-06-16 DIAGNOSIS — Y93G1 Activity, food preparation and clean up: Secondary | ICD-10-CM | POA: Insufficient documentation

## 2017-06-16 DIAGNOSIS — J45909 Unspecified asthma, uncomplicated: Secondary | ICD-10-CM | POA: Insufficient documentation

## 2017-06-16 DIAGNOSIS — Y998 Other external cause status: Secondary | ICD-10-CM | POA: Insufficient documentation

## 2017-06-16 DIAGNOSIS — W260XXA Contact with knife, initial encounter: Secondary | ICD-10-CM | POA: Insufficient documentation

## 2017-06-16 DIAGNOSIS — S61012A Laceration without foreign body of left thumb without damage to nail, initial encounter: Secondary | ICD-10-CM | POA: Insufficient documentation

## 2017-06-16 DIAGNOSIS — Z23 Encounter for immunization: Secondary | ICD-10-CM | POA: Insufficient documentation

## 2017-06-16 DIAGNOSIS — Z8673 Personal history of transient ischemic attack (TIA), and cerebral infarction without residual deficits: Secondary | ICD-10-CM | POA: Insufficient documentation

## 2017-06-16 DIAGNOSIS — Y929 Unspecified place or not applicable: Secondary | ICD-10-CM | POA: Insufficient documentation

## 2017-06-16 DIAGNOSIS — F17210 Nicotine dependence, cigarettes, uncomplicated: Secondary | ICD-10-CM | POA: Insufficient documentation

## 2017-06-16 DIAGNOSIS — S61412A Laceration without foreign body of left hand, initial encounter: Secondary | ICD-10-CM | POA: Insufficient documentation

## 2017-06-16 MED ORDER — LIDOCAINE-EPINEPHRINE-TETRACAINE (LET) SOLUTION
3.0000 mL | Freq: Once | NASAL | Status: AC
  Administered 2017-06-16: 3 mL via TOPICAL
  Filled 2017-06-16: qty 3

## 2017-06-16 MED ORDER — LIDOCAINE-EPINEPHRINE-TETRACAINE (LET) SOLUTION
NASAL | Status: AC
  Filled 2017-06-16: qty 3

## 2017-06-16 MED ORDER — LIDOCAINE HCL (PF) 1 % IJ SOLN
INTRAMUSCULAR | Status: AC
  Administered 2017-06-17
  Filled 2017-06-16: qty 5

## 2017-06-16 MED ORDER — TETANUS-DIPHTH-ACELL PERTUSSIS 5-2.5-18.5 LF-MCG/0.5 IM SUSP
0.5000 mL | Freq: Once | INTRAMUSCULAR | Status: AC
  Administered 2017-06-16: 0.5 mL via INTRAMUSCULAR
  Filled 2017-06-16: qty 0.5

## 2017-06-16 NOTE — ED Notes (Signed)
Pt cut L hand from in-between thumb and pointer finger to inside L palm on a knife tonight while cutting onions. Wound appears clean. Straight laceration, deep. Unable to move L thumb. Has sensation loss to distal thumb. Able to move pointer finger. Bleeding controlled at this time. Pt denies being UTD on tetanus shot. Alert, oriented, ambulatory. Family member at bedside. Re-wrapped area after this RN took wrapping off to assess laceration.

## 2017-06-16 NOTE — ED Triage Notes (Signed)
Patient to ER for c/o laceration between 1st and 2nd fingers on left hand. Tendon visible under bleeding. Patient unable to move thumb. Circulation intact.

## 2017-06-16 NOTE — ED Notes (Signed)
Pt reports cutting her thumb PTA while cooking. Pt states she cannot move the thumb voluntarily and that it feels completely numb distal to the laceration.

## 2017-06-17 MED ORDER — OXYCODONE-ACETAMINOPHEN 5-325 MG PO TABS
1.0000 | ORAL_TABLET | Freq: Once | ORAL | Status: AC
  Administered 2017-06-17: 1 via ORAL
  Filled 2017-06-17: qty 1

## 2017-06-17 MED ORDER — BACITRACIN ZINC 500 UNIT/GM EX OINT
TOPICAL_OINTMENT | Freq: Two times a day (BID) | CUTANEOUS | Status: DC
  Administered 2017-06-17: 1 via TOPICAL

## 2017-06-17 MED ORDER — BACITRACIN ZINC 500 UNIT/GM EX OINT
TOPICAL_OINTMENT | CUTANEOUS | Status: AC
  Filled 2017-06-17: qty 0.9

## 2017-06-17 NOTE — Discharge Instructions (Signed)
Please follow-up with orthopedic surgery in the office. There is a possibility for tendon injury which needs to be further evaluated and can be done in the office. Please keep your dressing dry and clean. Please return with any further concerns or problems.  PLEASE HAVE SUTURES REMOVED IN 10 DAYS

## 2017-06-17 NOTE — ED Provider Notes (Signed)
[facility] Emergency Department Provider Note   ____________________________________________   First MD Initiated Contact with Patient 06/16/17 2305     (approximate)  I have reviewed the triage vital signs and the nursing notes.   HISTORY  Chief Complaint Laceration    HPI Alexandra Stevenson is a 46 y.o. female who comes into the hospital today with a cut on her left thumb to the tendon. She reports that she was cooking and using a sharp knife when she cut herself. She states that she has no feeling in her left thumb from the knuckle all the way to the tip. She also states that she is unable to move her thumb. The patient rates her pain a 10 out of 10 in intensity currently. She reports that her wrist and her hand or throbbing. She didn't take any medication prior to coming in. The patient washed and wrapped it and came in. Her last tetanus was over 5 years ago. The patient is here for evaluation.   Past Medical History:  Diagnosis Date  . Asthma   . Cancer ([place]) 1990   cervical  . Stroke ([place])    x3  . Syncope and collapse   . TIA (transient ischemic attack)    x 3  . WPW (Wolff-Parkinson-White syndrome) 12/31/2012    Patient Active Problem List   Diagnosis Date Noted  . Fatigue 02/22/2013  . Leukocytosis 02/22/2013  . WPW (Wolff-Parkinson-White syndrome) 12/31/2012    Past Surgical History:  Procedure Laterality Date  . CERVICAL BIOPSY  1990    Prior to Admission medications   Medication Sig Start Date End Date Taking? Authorizing Provider  ibuprofen (ADVIL,MOTRIN) 200 MG tablet Take 200 mg by mouth every 6 (six) hours as needed for pain.    [provider]    Allergies No known allergies  No family history on file.  Social History Social History   Tobacco Use  . Smoking status: Current Every Day Smoker    Packs/day: 0.25    Years: 30.00    Pack years: 7.50    Types: Cigarettes  . Smokeless tobacco: Never Used    Substance Use Topics  . Alcohol use: Yes    Comment: occasional  . Drug use: Yes    Types: "Crack" cocaine, Marijuana, Cocaine    Comment: past/present    Review of Systems  Constitutional: No fever/chills Eyes: No visual changes. ENT: No sore throat. Cardiovascular: Denies chest pain. Respiratory: Denies shortness of breath. Gastrointestinal: No abdominal pain.  No nausea, no vomiting.  No diarrhea.  No constipation. Genitourinary: Negative for dysuria. Musculoskeletal: Negative for back pain. Skin: Laceration to left hand Neurological: Negative for headaches, focal weakness or numbness.   ____________________________________________   PHYSICAL EXAM:  VITAL SIGNS: ED Triage Vitals [06/16/17 2041]  Enc Vitals Group     BP 133/86     Pulse Rate 66     Resp 20     Temp 97.6 F (36.4 C)     Temp Source Oral     SpO2 95 %     Weight 140 lb (63.5 kg)     Height 5\' 7"  (1.702 m)     Head Circumference      Peak Flow      Pain Score 10     Pain Loc      Pain Edu?      Excl. in [place]?     Constitutional: Alert and oriented. Well appearing and in mild distress.  Eyes: Conjunctivae are normal. PERRL. EOMI. Head: Atraumatic. Nose: No congestion/rhinnorhea. Mouth/Throat: Mucous membranes are moist.  Oropharynx non-erythematous. Cardiovascular: Normal rate, regular rhythm. Grossly normal heart sounds.  Good peripheral circulation. Respiratory: Normal respiratory effort.  No retractions. Lungs CTAB. Gastrointestinal: Soft and nontender. No distention.  Musculoskeletal: No lower extremity tenderness nor edema.  Color intact at left thumb. Neurologic:  Normal speech and language. Diminished sensation to the left thumb and inability to move left thumb. Skin:  Skin is warm, dry 4.5 centimeter laceration to the area between the thumb and index finger. No visible tendon injury but it appears that the patient has a venous injury. No active bleeding. Psychiatric: Mood and affect are  normal. Speech and behavior are normal.  ____________________________________________   LABS (all labs ordered are listed, but only abnormal results are displayed)  Labs Reviewed - No data to display ____________________________________________  EKG  none ____________________________________________  RADIOLOGY  Dg Hand Complete Left  Result Date: 06/16/2017 CLINICAL DATA:  Pain, deep laceration EXAM: LEFT HAND - COMPLETE 3+ VIEW COMPARISON:  None. FINDINGS: No fracture or malalignment. Laceration between the first and second digits. No radiopaque foreign body IMPRESSION: No acute osseous abnormality Electronically Signed   By: Donavan Foil M.D.   On: 06/16/2017 21:31    ____________________________________________   PROCEDURES  Procedure(s) performed: please, see procedure note(s).  Marland Kitchen.Laceration Repair Date/Time: 06/16/2017 11:45 PM Performed by: Loney Hering, MD Authorized by: Loney Hering, MD   Consent:    Consent obtained:  Verbal   Consent given by:  Patient   Risks discussed:  Infection, pain, need for additional repair and tendon damage Anesthesia (see MAR for exact dosages):    Anesthesia method:  Topical application and local infiltration   Topical anesthetic:  LET   Local anesthetic:  Lidocaine 1% w/o epi Laceration details:    Location:  Hand   Hand location:  L palm   Length (cm):  4.5 Repair type:    Repair type:  Simple Pre-procedure details:    Preparation:  Patient was prepped and draped in usual sterile fashion and imaging obtained to evaluate for foreign bodies Exploration:    Hemostasis achieved with:  LET   Wound exploration: wound explored through full range of motion     Contaminated: no   Treatment:    Area cleansed with:  Betadine and Hibiclens   Amount of cleaning:  Standard   Irrigation solution:  Sterile water   Irrigation method:  Syringe Skin repair:    Repair method:  Sutures   Suture size:  4-0   Suture material:   Nylon   Suture technique:  Simple interrupted   Number of sutures:  7 Post-procedure details:    Dressing:  Antibiotic ointment, bulky dressing and splint for protection   Patient tolerance of procedure:  Tolerated well, no immediate complications    Critical Care performed: No  ____________________________________________   INITIAL IMPRESSION / ASSESSMENT AND PLAN / ED COURSE  As part of my medical decision making, I reviewed the following data within the electronic MEDICAL RECORD NUMBER Notes from prior ED visits and  Controlled Substance Database   This is a 46 year old who comes into the hospital today after obtaining a laceration to her left hand between her thumb and index finger. The patient says that she is unable to move it but she is able to move her thumb at the first MCP. She is able to slightly move at the interphalangeal joint but she still  endorses decreased sensation. I did suture the patient's wound.  I contacted Dr. Harlow Mares who was on for orthopedic surgery. I informed him of the patient's decreased sensation as well as inability to move. I informed him that I did not see a tendon injury but he feels that it is appropriate to suture the wound dressed it with a soft splint and have her follow-up in the office. The patient did receive a dose of Percocet. She'll be discharged home to follow-up with orthopedic surgery.      ____________________________________________   FINAL CLINICAL IMPRESSION(S) / ED DIAGNOSES  Final diagnoses:  Laceration of left hand, foreign body presence unspecified, initial encounter  Laceration of left thumb, foreign body presence unspecified, nail damage status unspecified, initial encounter      Note:  This document was prepared using Dragon voice recognition software and may include unintentional dictation errors.    Loney Hering, MD 06/17/17 [phone]

## 2017-08-21 ENCOUNTER — Ambulatory Visit: Payer: Medicaid Other | Attending: Family Medicine

## 2017-08-21 ENCOUNTER — Ambulatory Visit
Admission: EM | Admit: 2017-08-21 | Discharge: 2017-08-21 | Disposition: A | Discharge status: Home / Self Care | Payer: Self-pay | Attending: Family Medicine | Admitting: Family Medicine

## 2017-08-21 ENCOUNTER — Other Ambulatory Visit: Payer: Self-pay

## 2017-08-21 DIAGNOSIS — Z79899 Other long term (current) drug therapy: Secondary | ICD-10-CM | POA: Insufficient documentation

## 2017-08-21 DIAGNOSIS — J45909 Unspecified asthma, uncomplicated: Secondary | ICD-10-CM | POA: Insufficient documentation

## 2017-08-21 DIAGNOSIS — R1032 Left lower quadrant pain: Secondary | ICD-10-CM

## 2017-08-21 DIAGNOSIS — Z8673 Personal history of transient ischemic attack (TIA), and cerebral infarction without residual deficits: Secondary | ICD-10-CM | POA: Insufficient documentation

## 2017-08-21 DIAGNOSIS — M5489 Other dorsalgia: Secondary | ICD-10-CM

## 2017-08-21 DIAGNOSIS — F17210 Nicotine dependence, cigarettes, uncomplicated: Secondary | ICD-10-CM | POA: Insufficient documentation

## 2017-08-21 DIAGNOSIS — M545 Low back pain: Secondary | ICD-10-CM | POA: Insufficient documentation

## 2017-08-21 DIAGNOSIS — I456 Pre-excitation syndrome: Secondary | ICD-10-CM | POA: Insufficient documentation

## 2017-08-21 DIAGNOSIS — R310 Gross hematuria: Secondary | ICD-10-CM

## 2017-08-21 LAB — URINALYSIS, COMPLETE (UACMP) WITH MICROSCOPIC

## 2017-08-21 MED ORDER — SULFAMETHOXAZOLE-TRIMETHOPRIM 800-160 MG PO TABS: 1.0000 | ORAL_TABLET | Freq: Two times a day (BID) | ORAL | 0 refills | Status: AC

## 2017-08-21 NOTE — Discharge Instructions (Signed)
We will call with the results.  Antibiotic as prescribed (unless you hear from me).  Take care  Dr. Lacinda Axon

## 2017-08-21 NOTE — ED Triage Notes (Signed)
Patient states that she started having hematuria around 1 week ago. Patient states that she is having left lower abdominal pain and radiates to lower back. Patient states that she feels like she is "having labor in my lower back."

## 2017-08-21 NOTE — ED Provider Notes (Signed)
[facility]    CSN: [id number] Arrival date & time: 08/21/17  6967   History   Chief Complaint Chief Complaint  Patient presents with  . Hematuria   HPI  47 year old female presents with gross hematuria.  Patient reports a one-week history of hematuria.  She initially thought that this was secondary to menstrual spotting as she recently had spotting after having 11 months without a cycle.  However, she has visibly seen blood in her urine.  She denies any frequency, urgency, dysuria.  Yesterday she developed severe low back pain and severe left lower quadrant pain.  She states that her pain was so severe that she had to take some over-the-counter medication.  Her pain is currently 5/10 in severity.  She is having regular bowel movements.  No difficulty regarding this.  No reports of nausea vomiting.  No history of nephrolithiasis.  No other associated symptoms.  No other complaints at this time.  Past Medical History:  Diagnosis Date  . Asthma   . Cancer ([place]) 1990   cervical  . Stroke ([place])    x3  . Syncope and collapse   . TIA (transient ischemic attack)    x 3  . WPW (Wolff-Parkinson-White syndrome) 12/31/2012   Patient Active Problem List   Diagnosis Date Noted  . Fatigue 02/22/2013  . Leukocytosis 02/22/2013  . WPW (Wolff-Parkinson-White syndrome) 12/31/2012   Past Surgical History:  Procedure Laterality Date  . CERVICAL BIOPSY  1990   OB History    No data available     Home Medications    Prior to Admission medications   Medication Sig Start Date End Date Taking? Authorizing Provider  ibuprofen (ADVIL,MOTRIN) 200 MG tablet Take 200 mg by mouth every 6 (six) hours as needed for pain.   Yes [provider]  sulfamethoxazole-trimethoprim (BACTRIM DS,SEPTRA DS) 800-160 MG tablet Take 1 tablet by mouth 2 (two) times daily for 7 days. 08/21/17 08/28/17  Coral Spikes, DO    Family History History reviewed. No pertinent family history.  Social  History Social History   Tobacco Use  . Smoking status: Current Every Day Smoker    Packs/day: 0.25    Years: 30.00    Pack years: 7.50    Types: Cigarettes  . Smokeless tobacco: Never Used  Substance Use Topics  . Alcohol use: Yes    Comment: occasional  . Drug use: Yes    Types: "Crack" cocaine, Marijuana, Cocaine    Comment: past/present     Allergies   No known allergies   Review of Systems Review of Systems  Constitutional: Negative.   Gastrointestinal: Positive for abdominal pain.  Genitourinary: Positive for hematuria. Negative for dysuria, frequency and urgency.  Musculoskeletal: Positive for back pain.  All other systems reviewed and are negative.  Physical Exam Triage Vital Signs ED Triage Vitals  Enc Vitals Group     BP 08/21/17 0941 (!) 156/94     Pulse Rate 08/21/17 0941 (!) 59     Resp 08/21/17 0941 18     Temp 08/21/17 0941 97.8 F (36.6 C)     Temp Source 08/21/17 0941 Oral     SpO2 08/21/17 0941 100 %     Weight 08/21/17 0939 160 lb (72.6 kg)     Height 08/21/17 0939 5\' 3"  (1.6 m)     Head Circumference --      Peak Flow --      Pain Score 08/21/17 0939 5  Pain Loc --      Pain Edu? --      Excl. in [place]? --    Updated Vital Signs BP (!) 156/94 (BP Location: Left Arm)   Pulse (!) 59   Temp 97.8 F (36.6 C) (Oral)   Resp 18   Ht 5\' 3"  (1.6 m)   Wt 160 lb (72.6 kg)   LMP 09/21/2016   SpO2 100%   BMI 28.34 kg/m     Physical Exam  Constitutional: She is oriented to person, place, and time. She appears well-developed and well-nourished. No distress.  HENT:  Head: Normocephalic and atraumatic.  Nose: Nose normal.  Eyes: Conjunctivae are normal. Right eye exhibits no discharge. Left eye exhibits no discharge. No scleral icterus.  Cardiovascular: Normal rate and regular rhythm.  No murmur heard. Pulmonary/Chest: Effort normal and breath sounds normal. She has no wheezes. She has no rales.  Abdominal: Soft.  Protuberant abdomen.   Nondistended.  Severe left lower quadrant tenderness to palpation.  Musculoskeletal:  Lumbar spine -patient with midline tenderness.  Neurological: She is alert and oriented to person, place, and time.  Skin: Skin is warm. No rash noted.  Psychiatric: She has a normal mood and affect. Her behavior is normal.  Nursing note and vitals reviewed.  UC Treatments / Results  Labs (all labs ordered are listed, but only abnormal results are displayed) Labs Reviewed  URINALYSIS, COMPLETE (UACMP) WITH MICROSCOPIC - Abnormal; Notable for the following components:      Result Value   Color, Urine RED (*)    APPearance CLOUDY (*)    Glucose, UA   (*)    Value: TEST NOT REPORTED DUE TO COLOR INTERFERENCE OF URINE PIGMENT   Hgb urine dipstick   (*)    Value: TEST NOT REPORTED DUE TO COLOR INTERFERENCE OF URINE PIGMENT   Bilirubin Urine   (*)    Value: TEST NOT REPORTED DUE TO COLOR INTERFERENCE OF URINE PIGMENT   Ketones, ur   (*)    Value: TEST NOT REPORTED DUE TO COLOR INTERFERENCE OF URINE PIGMENT   Protein, ur   (*)    Value: TEST NOT REPORTED DUE TO COLOR INTERFERENCE OF URINE PIGMENT   Nitrite   (*)    Value: TEST NOT REPORTED DUE TO COLOR INTERFERENCE OF URINE PIGMENT   Leukocytes, UA   (*)    Value: TEST NOT REPORTED DUE TO COLOR INTERFERENCE OF URINE PIGMENT   Squamous Epithelial / LPF 0-5 (*)    Bacteria, UA FEW (*)    All other components within normal limits  URINE CULTURE    EKG  EKG Interpretation None       Radiology No results found.  Procedures Procedures (including critical care time)  Medications Ordered in UC Medications - No data to display   Initial Impression / Assessment and Plan / UC Course  I have reviewed the triage vital signs and the nursing notes.  Pertinent labs & imaging results that were available during my care of the patient were reviewed by me and considered in my medical decision making (see chart for details).    47 year old female  presents with gross hematuria, back pain, and left lower quadrant pain.  Urinalysis with too numerous to count blood cells, and 6-30 white blood cells.  Sending culture.  Given pyuria, placing on antibiotic therapy while awaiting culture.  Given her symptoms and clinical picture, arranging CT for further evaluation of gross hematuria and abdominal pain.  Concern  for nephrolithiasis.  Final Clinical Impressions(s) / UC Diagnoses   Final diagnoses:  Gross hematuria    ED Discharge Orders        Ordered    sulfamethoxazole-trimethoprim (BACTRIM DS,SEPTRA DS) 800-160 MG tablet  2 times daily     08/21/17 1005    CT RENAL STONE STUDY     08/21/17 1010     Controlled Substance Prescriptions [place] Controlled Substance Registry consulted? Not Applicable   Coral Spikes, DO 08/21/17 1019

## 2017-08-21 NOTE — ED Notes (Signed)
Contacted [employer] for [place] Medicaid prior approval.  No Authorization required for this subscriber.

## 2017-08-23 LAB — URINE CULTURE

## 2017-10-29 ENCOUNTER — Other Ambulatory Visit: Payer: Self-pay

## 2017-10-29 ENCOUNTER — Emergency Department
Admission: EM | Admit: 2017-10-29 | Discharge: 2017-10-30 | Disposition: A | Discharge status: Home / Self Care | Payer: Self-pay | Attending: Emergency Medicine | Admitting: Emergency Medicine

## 2017-10-29 ENCOUNTER — Emergency Department: Payer: Self-pay

## 2017-10-29 ENCOUNTER — Encounter: Payer: Self-pay | Admitting: Emergency Medicine

## 2017-10-29 DIAGNOSIS — Y9389 Activity, other specified: Secondary | ICD-10-CM | POA: Insufficient documentation

## 2017-10-29 DIAGNOSIS — I456 Pre-excitation syndrome: Secondary | ICD-10-CM | POA: Insufficient documentation

## 2017-10-29 DIAGNOSIS — S0003XA Contusion of scalp, initial encounter: Secondary | ICD-10-CM

## 2017-10-29 DIAGNOSIS — J45909 Unspecified asthma, uncomplicated: Secondary | ICD-10-CM | POA: Insufficient documentation

## 2017-10-29 DIAGNOSIS — F17210 Nicotine dependence, cigarettes, uncomplicated: Secondary | ICD-10-CM | POA: Insufficient documentation

## 2017-10-29 DIAGNOSIS — S8001XA Contusion of right knee, initial encounter: Secondary | ICD-10-CM | POA: Insufficient documentation

## 2017-10-29 DIAGNOSIS — Y92410 Unspecified street and highway as the place of occurrence of the external cause: Secondary | ICD-10-CM | POA: Insufficient documentation

## 2017-10-29 DIAGNOSIS — Z8541 Personal history of malignant neoplasm of cervix uteri: Secondary | ICD-10-CM | POA: Insufficient documentation

## 2017-10-29 DIAGNOSIS — F10920 Alcohol use, unspecified with intoxication, uncomplicated: Secondary | ICD-10-CM | POA: Insufficient documentation

## 2017-10-29 DIAGNOSIS — Z8673 Personal history of transient ischemic attack (TIA), and cerebral infarction without residual deficits: Secondary | ICD-10-CM | POA: Insufficient documentation

## 2017-10-29 DIAGNOSIS — S0101XA Laceration without foreign body of scalp, initial encounter: Secondary | ICD-10-CM | POA: Insufficient documentation

## 2017-10-29 DIAGNOSIS — Y998 Other external cause status: Secondary | ICD-10-CM | POA: Insufficient documentation

## 2017-10-29 LAB — HEPATIC FUNCTION PANEL
ALT: 21 U/L (ref 14–54)
AST: 27 U/L (ref 15–41)
Albumin: 4.8 g/dL (ref 3.5–5.0)
Alkaline Phosphatase: 62 U/L (ref 38–126)
Bilirubin, Direct: <0.1 mg/dL — ABNORMAL LOW (ref 0.1–0.5)
Total Bilirubin: 0.5 mg/dL (ref 0.3–1.2)
Total Protein: 8.8 g/dL — ABNORMAL HIGH (ref 6.5–8.1)

## 2017-10-29 LAB — ETHANOL: Alcohol, Ethyl (B): 126 mg/dL — ABNORMAL HIGH (ref <10)

## 2017-10-29 LAB — CBC
HCT: 49.2 % — ABNORMAL HIGH (ref 35.0–47.0)
Hemoglobin: 15.9 g/dL (ref 12.0–16.0)
MCH: 28.6 pg (ref 26.0–34.0)
MCHC: 32.3 g/dL (ref 32.0–36.0)
MCV: 88.5 fL (ref 80.0–100.0)
Platelets: 426 10*3/uL (ref 150–440)
RBC: 5.56 MIL/uL — ABNORMAL HIGH (ref 3.80–5.20)
RDW: 15.6 % — ABNORMAL HIGH (ref 11.5–14.5)
WBC: 22.6 10*3/uL — ABNORMAL HIGH (ref 3.6–11.0)

## 2017-10-29 LAB — URINALYSIS, COMPLETE (UACMP) WITH MICROSCOPIC
Bacteria, UA: NONE SEEN
Bilirubin Urine: NEGATIVE
Glucose, UA: NEGATIVE mg/dL
Ketones, ur: NEGATIVE mg/dL
Leukocytes, UA: NEGATIVE
Nitrite: NEGATIVE
Protein, ur: NEGATIVE mg/dL
Specific Gravity, Urine: 1.008 (ref 1.005–1.030)
pH: 5 (ref 5.0–8.0)

## 2017-10-29 LAB — BASIC METABOLIC PANEL
Anion gap: 11 (ref 5–15)
BUN: 7 mg/dL (ref 6–20)
CO2: 24 mmol/L (ref 22–32)
Calcium: 9.3 mg/dL (ref 8.9–10.3)
Chloride: 105 mmol/L (ref 101–111)
Creatinine, Ser: 0.77 mg/dL (ref 0.44–1.00)
GFR calc Af Amer: >60 mL/min (ref >60)
GFR calc non Af Amer: >60 mL/min (ref >60)
Glucose, Bld: 89 mg/dL (ref 65–99)
Potassium: 3.6 mmol/L (ref 3.5–5.1)
Sodium: 140 mmol/L (ref 135–145)

## 2017-10-29 MED ORDER — LORAZEPAM 2 MG/ML IJ SOLN
INTRAMUSCULAR | Status: AC
  Filled 2017-10-29: qty 1

## 2017-10-29 MED ORDER — SODIUM CHLORIDE 0.9 % IV BOLUS (SEPSIS)
1000.0000 mL | Freq: Once | INTRAVENOUS | Status: AC
  Administered 2017-10-29: 1000 mL via INTRAVENOUS

## 2017-10-29 MED ORDER — ONDANSETRON HCL 4 MG/2ML IJ SOLN
4.0000 mg | Freq: Once | INTRAMUSCULAR | Status: AC
  Administered 2017-10-29: 4 mg via INTRAVENOUS
  Filled 2017-10-29: qty 2

## 2017-10-29 MED ORDER — LORAZEPAM 2 MG/ML IJ SOLN
1.0000 mg | Freq: Once | INTRAMUSCULAR | Status: AC
  Administered 2017-10-29: 1 mg via INTRAVENOUS

## 2017-10-29 MED ORDER — LIDOCAINE-EPINEPHRINE-TETRACAINE (LET) SOLUTION
3.0000 mL | Freq: Once | NASAL | Status: AC
  Administered 2017-10-29: 3 mL via TOPICAL
  Filled 2017-10-29: qty 3

## 2017-10-29 MED ORDER — ACETAMINOPHEN 325 MG PO TABS
650.0000 mg | ORAL_TABLET | Freq: Once | ORAL | Status: AC
  Administered 2017-10-29: 650 mg via ORAL
  Filled 2017-10-29: qty 2

## 2017-10-29 NOTE — ED Notes (Signed)
Pt very anxious, order for ativan obtained. See [facility]

## 2017-10-29 NOTE — ED Notes (Addendum)
LET applied prior to pt to CT
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
## 2017-10-29 NOTE — Discharge Instructions (Signed)
These make an appoint with your primary care physician or with a primary care doctor at the [facility] clinic for staple removal in 5-7 days.  You may take Tylenol or Motrin for your pain.  Do not drink and drive.  For your right knee, you may apply ice for 15 minutes every 2 hours and keep your knee elevated above the level of your heart as much as possible.  If you continue to have knee pain, follow-up with your primary care physician.  Return to the emergency department if you develop severe pain, headache, vomiting, or any other symptoms concerning to you.
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
## 2017-10-30 NOTE — ED Notes (Signed)
Pt. Verbalizes understanding of d/c instructions, medications, and follow-up. VS stable.  Pt. In NAD at time of d/c and denies further concerns regarding this visit. Pt. Stable at the time of departure from the unit, departing unit by the safest and most appropriate manner per that pt condition and limitations with all belongings accounted for. Pt advised to return to the ED at any time for emergent concerns, or for new/worsening symptoms.   

## 2017-10-30 NOTE — ED Notes (Signed)
Patient ambulatory to toilet with very uncoordinated limp, flailing upper limbs while bent over in 90 degree angle. patient encouraged to stand upright, and try to balance, bearing minimal weight to right leg.

## 2017-11-03 ENCOUNTER — Encounter: Payer: Self-pay | Admitting: *Deleted

## 2017-11-03 ENCOUNTER — Other Ambulatory Visit: Payer: Self-pay

## 2017-11-03 ENCOUNTER — Emergency Department
Admission: EM | Admit: 2017-11-03 | Discharge: 2017-11-03 | Disposition: A | Discharge status: Home / Self Care | Payer: Medicaid Other | Attending: Emergency Medicine | Admitting: Emergency Medicine

## 2017-11-03 DIAGNOSIS — F17210 Nicotine dependence, cigarettes, uncomplicated: Secondary | ICD-10-CM | POA: Insufficient documentation

## 2017-11-03 DIAGNOSIS — J45909 Unspecified asthma, uncomplicated: Secondary | ICD-10-CM | POA: Insufficient documentation

## 2017-11-03 DIAGNOSIS — Z4802 Encounter for removal of sutures: Secondary | ICD-10-CM | POA: Insufficient documentation
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
## 2017-11-03 NOTE — ED Notes (Addendum)
3 staples removed from the right side of scalp. Pt tolerated well. No bleeding or oozing noted. Pt has no other complaints. No distress noted.
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
## 2019-03-16 ENCOUNTER — Ambulatory Visit (LOCAL_COMMUNITY_HEALTH_CENTER): Payer: Self-pay

## 2019-03-16 ENCOUNTER — Other Ambulatory Visit: Payer: Self-pay

## 2019-03-16 DIAGNOSIS — B9689 Other specified bacterial agents as the cause of diseases classified elsewhere: Secondary | ICD-10-CM

## 2019-03-16 DIAGNOSIS — N760 Acute vaginitis: Secondary | ICD-10-CM

## 2019-03-16 DIAGNOSIS — Z113 Encounter for screening for infections with a predominantly sexual mode of transmission: Secondary | ICD-10-CM

## 2019-03-16 DIAGNOSIS — Z202 Contact with and (suspected) exposure to infections with a predominantly sexual mode of transmission: Secondary | ICD-10-CM

## 2019-03-16 LAB — HM HIV SCREENING LAB: HM HIV Screening: NEGATIVE

## 2019-03-16 LAB — HM HEPATITIS C SCREENING LAB: HM Hepatitis Screen: NEGATIVE

## 2019-03-16 MED ORDER — AZITHROMYCIN 500 MG PO TABS
500.0000 mg | ORAL_TABLET | Freq: Once | ORAL | Status: AC
  Administered 2019-03-16: 500 mg via ORAL

## 2019-03-16 MED ORDER — CEFTRIAXONE SODIUM 250 MG IJ SOLR
250.0000 mg | Freq: Once | INTRAMUSCULAR | Status: AC
  Administered 2019-03-16: 250 mg via INTRAMUSCULAR

## 2019-03-16 MED ORDER — METRONIDAZOLE 500 MG PO TABS: 500.0000 mg | ORAL_TABLET | Freq: Three times a day (TID) | ORAL | 0 refills | Status: DC

## 2019-03-16 NOTE — Progress Notes (Signed)
Patient here with partner for STD testing.Jenetta Downer, RN
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
## 2019-03-16 NOTE — Progress Notes (Signed)
    STI clinic/screening visit  Subjective:  Alexandra Stevenson is a 48 y.o. female being seen today for an STI screening visit. The patient reports they do have symptoms.  Patient has the following medical conditions:   Patient Active Problem List   Diagnosis Date Noted  . Fatigue 02/22/2013  . Leukocytosis 02/22/2013  . WPW (Wolff-Parkinson-White syndrome) 12/31/2012     Chief Complaint  Patient presents with  . Exposure to STD    HPI  Patient reports that she is here for testing d/t partner was treated for [facility] with 2 medications.States they were sexually active last week and the condoms broke.  States that she and partner are here today for evaualtion  See flowsheet for further details and programmatic requirements.    The following portions of the patient's history were reviewed and updated as appropriate: allergies, current medications, past medical history, past social history, past surgical history and problem list.  Objective:  There were no vitals filed for this visit.  Physical Exam HENT:     Mouth/Throat:     Pharynx: Oropharynx is clear.  Neck:     Musculoskeletal: Neck supple. No muscular tenderness.  Abdominal:     General: Abdomen is flat.     Palpations: Abdomen is soft.     Tenderness: There is no abdominal tenderness.  Genitourinary:    General: Normal vulva.     Vagina: Vaginal discharge present.     Rectum: Normal.     Comments: Thin, yellow disch, No CMT, no adnexal tenderness, mass, pH > 4.5 Lymphadenopathy:     Cervical: No cervical adenopathy.  Skin:    General: Skin is warm and dry.     Findings: No lesion or rash.  Neurological:     Mental Status: She is alert.     Assessment and Plan:  Alexandra Stevenson is a 48 y.o. female presenting to the [facility] for STI screening  1. Screening examination for venereal disease  - WET PREP FOR [place], YEAST, CLUE - Chlamydia/Gonorrhea [place] [facility] [facility] - HIV/HCV [facility] - Syphilis  Serology, [facility]  2. Venereal disease contact Treat for contact to [facility] with Ceftriaxone 250 mg IM  Azithromycin 1000 mg po  3. Bacterial vaginitis Metronidazole 500 mg take 1 tablet BID x 7 days.  Co. Not have sex x 7 days Condoms always. Notify clinic if has concerns or problems     No follow-ups on file.  No future appointments.  Hassell Done, FNP

## 2019-03-17 LAB — WET PREP FOR TRICH, YEAST, CLUE
Trichomonas Exam: NEGATIVE
Yeast Exam: NEGATIVE

## 2019-09-19 ENCOUNTER — Other Ambulatory Visit: Payer: Self-pay

## 2019-09-19 ENCOUNTER — Emergency Department: Payer: Self-pay

## 2019-09-19 ENCOUNTER — Emergency Department
Admission: EM | Admit: 2019-09-19 | Discharge: 2019-09-19 | Discharge status: Against Medical Advice | Payer: Self-pay | Attending: Student | Admitting: Student

## 2019-09-19 DIAGNOSIS — G459 Transient cerebral ischemic attack, unspecified: Secondary | ICD-10-CM | POA: Insufficient documentation

## 2019-09-19 DIAGNOSIS — J45909 Unspecified asthma, uncomplicated: Secondary | ICD-10-CM | POA: Insufficient documentation

## 2019-09-19 DIAGNOSIS — Z79899 Other long term (current) drug therapy: Secondary | ICD-10-CM | POA: Insufficient documentation

## 2019-09-19 DIAGNOSIS — F17210 Nicotine dependence, cigarettes, uncomplicated: Secondary | ICD-10-CM | POA: Insufficient documentation

## 2019-09-19 LAB — COMPREHENSIVE METABOLIC PANEL
ALT: 19 U/L (ref 0–44)
AST: 25 U/L (ref 15–41)
Albumin: 4.2 g/dL (ref 3.5–5.0)
Alkaline Phosphatase: 57 U/L (ref 38–126)
Anion gap: 10 (ref 5–15)
BUN: 12 mg/dL (ref 6–20)
CO2: 23 mmol/L (ref 22–32)
Calcium: 8.9 mg/dL (ref 8.9–10.3)
Chloride: 109 mmol/L (ref 98–111)
Creatinine, Ser: 0.93 mg/dL (ref 0.44–1.00)
GFR calc Af Amer: >60 mL/min (ref >60)
GFR calc non Af Amer: >60 mL/min (ref >60)
Glucose, Bld: 87 mg/dL (ref 70–99)
Potassium: 3.3 mmol/L — ABNORMAL LOW (ref 3.5–5.1)
Sodium: 142 mmol/L (ref 135–145)
Total Bilirubin: 0.2 mg/dL — ABNORMAL LOW (ref 0.3–1.2)
Total Protein: 7.4 g/dL (ref 6.5–8.1)

## 2019-09-19 LAB — DIFFERENTIAL
Abs Immature Granulocytes: 0.02 10*3/uL (ref 0.00–0.07)
Basophils Absolute: 0.1 10*3/uL (ref 0.0–0.1)
Basophils Relative: 1 %
Eosinophils Absolute: 0.4 10*3/uL (ref 0.0–0.5)
Eosinophils Relative: 4 %
Immature Granulocytes: 0 %
Lymphocytes Relative: 37 %
Lymphs Abs: 3.9 10*3/uL (ref 0.7–4.0)
Monocytes Absolute: 0.7 10*3/uL (ref 0.1–1.0)
Monocytes Relative: 6 %
Neutro Abs: 5.5 10*3/uL (ref 1.7–7.7)
Neutrophils Relative %: 52 %

## 2019-09-19 LAB — CBC
HCT: 42.2 % (ref 36.0–46.0)
Hemoglobin: 14.1 g/dL (ref 12.0–15.0)
MCH: 29.4 pg (ref 26.0–34.0)
MCHC: 33.4 g/dL (ref 30.0–36.0)
MCV: 88.1 fL (ref 80.0–100.0)
Platelets: 380 10*3/uL (ref 150–400)
RBC: 4.79 MIL/uL (ref 3.87–5.11)
RDW: 15.9 % — ABNORMAL HIGH (ref 11.5–15.5)
WBC: 10.6 10*3/uL — ABNORMAL HIGH (ref 4.0–10.5)
nRBC: 0 % (ref 0.0–0.2)

## 2019-09-19 LAB — PROTIME-INR
INR: 1 (ref 0.8–1.2)
Prothrombin Time: 12.6 seconds (ref 11.4–15.2)

## 2019-09-19 LAB — APTT: aPTT: 41 seconds — ABNORMAL HIGH (ref 24–36)

## 2019-09-19 MED ORDER — SODIUM CHLORIDE 0.9% FLUSH
3.0000 mL | Freq: Once | INTRAVENOUS | Status: AC
Start: 2019-09-19 — End: 2019-09-19
  Administered 2019-09-19: 3 mL via INTRAVENOUS

## 2019-09-19 NOTE — ED Notes (Signed)
Patient to CT.

## 2019-09-19 NOTE — ED Notes (Signed)
Patient is anxious, asks to sign AMA form prior to CT scan and wants to know how long until she will receive her CT

## 2019-09-19 NOTE — Discharge Instructions (Signed)
Thank you for letting us take care of you in the emergency department today.   We recommended taking daily aspirin to help prevent further mini strokes or stroke events.   Please return to the ER for any new or worsening symptoms.
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
## 2019-09-19 NOTE — ED Provider Notes (Signed)
[facility] Emergency Department Provider Note  ____________________________________________   First MD Initiated Contact with Patient 09/19/19 2254     (approximate)  I have reviewed the triage vital signs and the nursing notes.  History  Chief Complaint Numbness and Facial Droop    HPI Alexandra Stevenson is a 49 y.o. female who presents for a 30 minute episode, now resolved, of RUE numbness, slurred speech, facial droop. Symptoms started approximately 9 PM, witnessed by fire department, but resolved by EMS arrival. Currently has no complaints and asking to leave. Reports significant anxiety being in the hospital during the [place] pandemic.   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 Does not take any daily medications.    Past Medical Hx Past Medical History:  Diagnosis Date  . Asthma   . Cancer ([place]) 1990   cervical  . Stroke ([place])    x3  . Syncope and collapse   . TIA (transient ischemic attack)    x 3  . WPW (Wolff-Parkinson-White syndrome) 12/31/2012    Problem List Patient Active Problem List   Diagnosis Date Noted  . Fatigue 02/22/2013  . Leukocytosis 02/22/2013  . WPW (Wolff-Parkinson-White syndrome) 12/31/2012    Past Surgical Hx Past Surgical History:  Procedure Laterality Date  . CERVICAL BIOPSY  1990    Medications Prior to Admission medications   Medication Sig Start Date End Date Taking? Authorizing Provider  ibuprofen (ADVIL,MOTRIN) 200 MG tablet Take 200 mg by mouth every 6 (six) hours as needed for pain.    [provider]  metroNIDAZOLE (FLAGYL) 500 MG tablet Take 1 tablet (500 mg total) by mouth 3 (three) times daily. 03/16/19   Hassell Done, FNP    Allergies No known allergies  Family Hx No family history on file.  Social Hx Social History   Tobacco Use  . Smoking status: Current Every Day Smoker    Packs/day: 0.50    Years: 30.00    Pack years: 15.00    Types: Cigarettes  . Smokeless tobacco: Never Used    Substance Use Topics  . Alcohol use: Yes    Comment: occasional  . Drug use: Yes    Types: "Crack" cocaine, Marijuana, Cocaine    Comment: past/present     Review of Systems  Constitutional: Negative for fever, chills. Eyes: Negative for visual changes. ENT: Negative for sore throat. Cardiovascular: Negative for chest pain. Respiratory: Negative for shortness of breath. Gastrointestinal: Negative for nausea, vomiting.  Genitourinary: Negative for dysuria. Musculoskeletal: Negative for leg swelling. Skin: Negative for rash. Neurological: Negative for headaches. + facial droop, slurred speech, numbness   Physical Exam  Vital Signs: ED Triage Vitals  Enc Vitals Group     BP 09/19/19 2224 (!) 142/97     Pulse Rate 09/19/19 2224 87     Resp 09/19/19 2224 18     Temp 09/19/19 2224 98.1 F (36.7 C)     Temp Source 09/19/19 2224 Oral     SpO2 09/19/19 2224 100 %     Weight 09/19/19 2227 150 lb (68 kg)     Height 09/19/19 2227 5\' 3"  (1.6 m)     Head Circumference --      Peak Flow --      Pain Score 09/19/19 2227 0     Pain Loc --      Pain Edu? --      Excl. in [place]? --     Constitutional: Alert and oriented.  Head: Normocephalic. Atraumatic. Eyes:  Conjunctivae clear. Sclera anicteric. Nose: No congestion. No rhinorrhea. Mouth/Throat: Wearing mask.  Neck: No stridor.   Cardiovascular: Normal rate, regular rhythm. Extremities well perfused. Respiratory: Normal respiratory effort.    Musculoskeletal: No lower extremity edema. No deformities. Neurologic:  Normal speech and language. No gross focal neurologic deficits are appreciated. Alert and oriented.  Face symmetric.  Tongue midline.  Cranial nerves II through XII intact. UE and LE strength 5/5 and symmetric. UE and LE SILT. NIHSS 0.  Skin: Skin is warm, dry and intact. No rash noted. Psychiatric: Mood and affect are appropriate for situation.  EKG  Personally reviewed.   Rate: 74 Rhythm: sinus Axis:  normal Intervals: short PR, WPW NSR, WPW No STEMI    Radiology  CT head done, read pending at time of patient discharge AMA.  CT read reviewed after patient's discharge, negative.   Procedures  Procedure(s) performed (including critical care):  Procedures   Initial Impression / Assessment and Plan / ED Course  49 y.o. female who presents to the ED for 30 minute episode, now resolved, of facial droop, slurred speech, RUE numbness, concerning for TIA vs CVA.  On exam she is neurologically in tact w/ no deficits. NIHSS 0.   Labs and imaging ordered on arrival.   She is adamant that she does not want to stay in the ED or desire admission for further work up of any kind, outside of blood work that has already been drawn and [place] that has been ordered. She voices understanding that without further work up and/or admission, we cannot further evaluate for stroke and related risk factors. She remains adamant that she would like to leave immediately after CT, without waiting for results - if CT read is abnormal she states she will return.  States she has anxiety in general, and especially about being here during the pandemic. She understands the risks of leaving w/o complete work up include possible recurrence of TIA symptoms, more severe stroke and debilitating symptoms/deficits, and even death. She elects to leave AMA. She is competent, and voices understanding of the risks. Advised daily ASA for prevention, she declines this as well and states given her hx of drug addiction she does not want to be "dependent" on any daily medicines. Patient chose to leave AMA.    Final Clinical Impression(s) / ED Diagnosis  Final diagnoses:  TIA (transient ischemic attack)       Note:  This document was prepared using Dragon voice recognition software and may include unintentional dictation errors.   Lilia Pro., MD 09/20/19 [phone]

## 2019-09-19 NOTE — ED Notes (Signed)
Patient communicated with Dr. Joan Mayans, patient states she wants her head CT and her IV removed and is ready to go home. Patient will be leaving AMA after completion of head CT

## 2020-06-19 ENCOUNTER — Ambulatory Visit: Payer: Self-pay | Admitting: Physician Assistant

## 2020-06-19 ENCOUNTER — Other Ambulatory Visit: Payer: Self-pay

## 2020-06-19 ENCOUNTER — Encounter: Payer: Self-pay | Admitting: Physician Assistant

## 2020-06-19 DIAGNOSIS — N760 Acute vaginitis: Secondary | ICD-10-CM

## 2020-06-19 DIAGNOSIS — B9689 Other specified bacterial agents as the cause of diseases classified elsewhere: Secondary | ICD-10-CM

## 2020-06-19 DIAGNOSIS — Z113 Encounter for screening for infections with a predominantly sexual mode of transmission: Secondary | ICD-10-CM

## 2020-06-19 LAB — WET PREP FOR TRICH, YEAST, CLUE
Trichomonas Exam: NEGATIVE
Yeast Exam: NEGATIVE

## 2020-06-19 MED ORDER — METRONIDAZOLE 500 MG PO TABS: 500.0000 mg | ORAL_TABLET | Freq: Two times a day (BID) | ORAL | 0 refills | Status: AC

## 2020-06-19 NOTE — Progress Notes (Signed)
[facility] Department STI clinic/screening visit  Subjective:  Alexandra Stevenson is a 49 y.o. female being seen today for an STI screening visit. The patient reports they do have symptoms.  Patient reports that they do not desire a pregnancy in the next year.   They reported they are not interested in discussing contraception today.  No LMP recorded. (Menstrual status: Perimenopausal).   Patient has the following medical conditions:   Patient Active Problem List   Diagnosis Date Noted  . Fatigue 02/22/2013  . Leukocytosis 02/22/2013  . WPW (Wolff-Parkinson-White syndrome) 12/31/2012    Chief Complaint  Patient presents with  . SEXUALLY TRANSMITTED DISEASE    screening    HPI  Patient reports that she has had a "foul" vaginal odor for 2 weeks.  Denies other symptoms, chronic conditions and regular medicines.  Reports that she last had a HIV test in 2020 and also had her last pap then as well.  States last period was 1-2 years ago.  See flowsheet for further details and programmatic requirements.    The following portions of the patient's history were reviewed and updated as appropriate: allergies, current medications, past medical history, past social history, past surgical history and problem list.  Objective:  There were no vitals filed for this visit.  Physical Exam Constitutional:      General: She is not in acute distress.    Appearance: Normal appearance.  HENT:     Head: Normocephalic and atraumatic.     Comments: No nits,lice, or hair loss. No cervical, supraclavicular or axillary adenopathy.    Mouth/Throat:     Mouth: Mucous membranes are moist.     Pharynx: Oropharynx is clear. No oropharyngeal exudate or posterior oropharyngeal erythema.  Eyes:     Conjunctiva/sclera: Conjunctivae normal.  Pulmonary:     Effort: Pulmonary effort is normal.  Abdominal:     Palpations: Abdomen is soft. There is no mass.     Tenderness: There is no abdominal  tenderness. There is no guarding or rebound.  Genitourinary:    General: Normal vulva.     Rectum: Normal.     Comments: External genitalia/pubic area without nits, lice, edema, erythema, lesions and inguinal adenopathy. Vagina with normal mucosa and small amount of thin, white  Discharge,  PH=>4.5. Cervix without visible lesions. Uterus firm, mobile, nt, no masses, no CMT, no adnexal tenderness or fullness. Musculoskeletal:     Cervical back: Neck supple. No tenderness.  Skin:    General: Skin is warm and dry.     Findings: No bruising, erythema, lesion or rash.  Neurological:     Mental Status: She is alert and oriented to person, place, and time.  Psychiatric:        Mood and Affect: Mood normal.        Behavior: Behavior normal.        Thought Content: Thought content normal.        Judgment: Judgment normal.      Assessment and Plan:  Kirsta Probert is a 49 y.o. female presenting to the [facility] Department for STI screening  1. Screening for STD (sexually transmitted disease) Patient into clinic with symptoms. Rec condoms with all sex. Await test results.  Counseled that RN will call if needs to RTC for treatment once results are back. - WET PREP FOR [place], YEAST, CLUE - Chlamydia/Gonorrhea [place] [facility] - HIV [place] [facility] - Syphilis Serology,  Lab  2. BV (bacterial vaginosis) Treat  for BV with Metronidazole 500 mg #14 1 po BID for 7 days with food, no EtOH for 24 hr before and until 72 hr after completing medicine. No sex for 10 days. Enc to use OTC antifungal cream if has itching during or just after treatment with antibiotic. - metroNIDAZOLE (FLAGYL) 500 MG tablet; Take 1 tablet (500 mg total) by mouth 2 (two) times daily for 7 days.  Dispense: 14 tablet; Refill: 0     Return if symptoms worsen or fail to improve.  No future appointments.  Jerene Dilling, PA

## 2020-06-19 NOTE — Progress Notes (Signed)
Wet mount reviewed and pt treated for BV per standing order and per Antoine Primas, PA verbal order. Provider orders completed.

## 2020-12-27 ENCOUNTER — Other Ambulatory Visit: Payer: Self-pay

## 2020-12-27 ENCOUNTER — Emergency Department: Payer: Self-pay

## 2020-12-27 ENCOUNTER — Emergency Department
Admission: EM | Admit: 2020-12-27 | Discharge: 2020-12-27 | Disposition: A | Discharge status: Home / Self Care | Payer: Self-pay | Attending: Emergency Medicine | Admitting: Emergency Medicine

## 2020-12-27 DIAGNOSIS — J45909 Unspecified asthma, uncomplicated: Secondary | ICD-10-CM | POA: Insufficient documentation

## 2020-12-27 DIAGNOSIS — Z8541 Personal history of malignant neoplasm of cervix uteri: Secondary | ICD-10-CM | POA: Insufficient documentation

## 2020-12-27 DIAGNOSIS — F17210 Nicotine dependence, cigarettes, uncomplicated: Secondary | ICD-10-CM | POA: Insufficient documentation

## 2020-12-27 DIAGNOSIS — Z7951 Long term (current) use of inhaled steroids: Secondary | ICD-10-CM | POA: Insufficient documentation

## 2020-12-27 DIAGNOSIS — Z720 Tobacco use: Secondary | ICD-10-CM

## 2020-12-27 DIAGNOSIS — I456 Pre-excitation syndrome: Secondary | ICD-10-CM | POA: Insufficient documentation

## 2020-12-27 DIAGNOSIS — R079 Chest pain, unspecified: Secondary | ICD-10-CM

## 2020-12-27 LAB — PROCALCITONIN: Procalcitonin: <0.1 ng/mL

## 2020-12-27 LAB — CBC
HCT: 46 % (ref 36.0–46.0)
Hemoglobin: 15.5 g/dL — ABNORMAL HIGH (ref 12.0–15.0)
MCH: 29.1 pg (ref 26.0–34.0)
MCHC: 33.7 g/dL (ref 30.0–36.0)
MCV: 86.5 fL (ref 80.0–100.0)
Platelets: 382 10*3/uL (ref 150–400)
RBC: 5.32 MIL/uL — ABNORMAL HIGH (ref 3.87–5.11)
RDW: 15.9 % — ABNORMAL HIGH (ref 11.5–15.5)
WBC: 15.2 10*3/uL — ABNORMAL HIGH (ref 4.0–10.5)
nRBC: 0 % (ref 0.0–0.2)

## 2020-12-27 LAB — BASIC METABOLIC PANEL
Anion gap: 10 (ref 5–15)
BUN: 10 mg/dL (ref 6–20)
CO2: 25 mmol/L (ref 22–32)
Calcium: 9.2 mg/dL (ref 8.9–10.3)
Chloride: 102 mmol/L (ref 98–111)
Creatinine, Ser: 1.01 mg/dL — ABNORMAL HIGH (ref 0.44–1.00)
GFR, Estimated: >60 mL/min (ref >60)
Glucose, Bld: 82 mg/dL (ref 70–99)
Potassium: 4.7 mmol/L (ref 3.5–5.1)
Sodium: 137 mmol/L (ref 135–145)

## 2020-12-27 LAB — TROPONIN I (HIGH SENSITIVITY)
Troponin I (High Sensitivity): 5 ng/L (ref <18)
Troponin I (High Sensitivity): 5 ng/L (ref <18)

## 2020-12-27 LAB — MAGNESIUM: Magnesium: 2 mg/dL (ref 1.7–2.4)

## 2020-12-27 MED ORDER — MORPHINE SULFATE (PF) 4 MG/ML IV SOLN
4.0000 mg | Freq: Once | INTRAVENOUS | Status: AC
  Administered 2020-12-27: 4 mg via INTRAVENOUS
  Filled 2020-12-27: qty 1

## 2020-12-27 MED ORDER — NITROGLYCERIN 0.4 MG SL SUBL: 0.4000 mg | SUBLINGUAL_TABLET | SUBLINGUAL | Status: DC | PRN

## 2020-12-27 NOTE — ED Provider Notes (Signed)
[facility] Emergency Department Provider Note  ____________________________________________   Event Date/Time   First MD Initiated Contact with Patient 12/27/20 1959     (approximate)  I have reviewed the triage vital signs and the nursing notes.   HISTORY  Chief Complaint Chest Pain   HPI Alexandra Stevenson is a 50 y.o. female with a past medical history of asthma, tobacco abuse, CVA, and WPW who presents for assessment of some left-sided chest pain that started around 5 PM today.  She did take aspirin for tablets and 3 nitroglycerin with EMS.  Endorsing shortness of breath with her pain.  She states aspirin seem to help a little bit.  No prior similar episodes.  No other clear alleviating aggravating factors.  She denies any headache, earache, sore throat, change in chronic cough, abdominal pain, back pain, nausea, vomiting, diarrhea, dysuria, rash or any other acute sick symptoms.  No known cardiac history.  Endorses drinking 1-2 beers per night and using some marijuana couple days ago but no other illicit drug use.         Past Medical History:  Diagnosis Date  . Asthma   . Cancer ([place]) 1990   cervical  . Stroke ([place])    x3  . Syncope and collapse   . TIA (transient ischemic attack)    x 3  . WPW (Wolff-Parkinson-White syndrome) 12/31/2012    Patient Active Problem List   Diagnosis Date Noted  . Fatigue 02/22/2013  . Leukocytosis 02/22/2013  . WPW (Wolff-Parkinson-White syndrome) 12/31/2012    Past Surgical History:  Procedure Laterality Date  . CERVICAL BIOPSY  1990    Prior to Admission medications   Medication Sig Start Date End Date Taking? Authorizing Provider  albuterol (VENTOLIN HFA) 108 (90 Base) MCG/ACT inhaler Inhale into the lungs. 11/12/18   [provider]  ibuprofen (ADVIL,MOTRIN) 200 MG tablet Take 200 mg by mouth every 6 (six) hours as needed for pain.    [provider]  metroNIDAZOLE (FLAGYL) 500 MG tablet  Take 1 tablet (500 mg total) by mouth 3 (three) times daily. Patient not taking: Reported on 06/19/2020 03/16/19   Hassell Done, FNP    Allergies No known allergies  No family history on file.  Social History Social History   Tobacco Use  . Smoking status: Current Every Day Smoker    Packs/day: 0.50    Years: 30.00    Pack years: 15.00    Types: Cigarettes  . Smokeless tobacco: Never Used  Vaping Use  . Vaping Use: Never used  Substance Use Topics  . Alcohol use: Yes    Comment: everyday  . Drug use: Yes    Types: "Crack" cocaine, Marijuana    Review of Systems  Review of Systems  Constitutional: Negative for chills and fever.  HENT: Negative for sore throat.   Eyes: Negative for pain.  Respiratory: Positive for shortness of breath. Negative for cough and stridor.   Cardiovascular: Positive for chest pain.  Gastrointestinal: Negative for vomiting.  Genitourinary: Negative for dysuria.  Musculoskeletal: Negative for myalgias.  Skin: Negative for rash.  Neurological: Negative for seizures, loss of consciousness and headaches.  Psychiatric/Behavioral: Negative for suicidal ideas.  All other systems reviewed and are negative.     ____________________________________________   PHYSICAL EXAM:  VITAL SIGNS: ED Triage Vitals  Enc Vitals Group     BP 12/27/20 1845 (!) 146/103     Pulse Rate 12/27/20 1845 84     Resp 12/27/20  1845 18     Temp 12/27/20 1845 97.7 F (36.5 C)     Temp src --      SpO2 12/27/20 1845 96 %     Weight --      Height --      Head Circumference --      Peak Flow --      Pain Score 12/27/20 1842 5     Pain Loc --      Pain Edu? --      Excl. in [place]? --    Vitals:   12/27/20 1845 12/27/20 2123  BP: (!) 146/103 120/88  Pulse: 84 70  Resp: 18 18  Temp: 97.7 F (36.5 C)   SpO2: 96% 95%   Physical Exam Vitals and nursing note reviewed.  Constitutional:      General: She is not in acute distress.    Appearance: She is  well-developed.  HENT:     Head: Normocephalic and atraumatic.     Right Ear: External ear normal.     Left Ear: External ear normal.     Nose: Nose normal.  Eyes:     Conjunctiva/sclera: Conjunctivae normal.  Cardiovascular:     Rate and Rhythm: Normal rate and regular rhythm.     Heart sounds: No murmur heard.   Pulmonary:     Effort: Pulmonary effort is normal. No respiratory distress.     Breath sounds: Normal breath sounds.  Abdominal:     Palpations: Abdomen is soft.     Tenderness: There is no abdominal tenderness.  Musculoskeletal:     Cervical back: Neck supple.  Skin:    General: Skin is warm and dry.     Capillary Refill: Capillary refill takes less than 2 seconds.  Neurological:     Mental Status: She is alert and oriented to person, place, and time.  Psychiatric:        Mood and Affect: Mood normal.      ____________________________________________   LABS (all labs ordered are listed, but only abnormal results are displayed)  Labs Reviewed  BASIC METABOLIC PANEL - Abnormal; Notable for the following components:      Result Value   Creatinine, Ser 1.01 (*)    All other components within normal limits  CBC - Abnormal; Notable for the following components:   WBC 15.2 (*)    RBC 5.32 (*)    Hemoglobin 15.5 (*)    RDW 15.9 (*)    All other components within normal limits  MAGNESIUM  PROCALCITONIN  POC URINE PREG, ED  TROPONIN I (HIGH SENSITIVITY)  TROPONIN I (HIGH SENSITIVITY)   ____________________________________________  EKG  Sinus rhythm with a ventricular rate of 76, QTC of 533 and evidence of delta wave with ventricular preexcitation. ____________________________________________  RADIOLOGY  ED MD interpretation: no focal consolidation, effusion, significant edema, pneumothorax or any other clear acute intrathoracic process.  Official radiology report(s): DG Chest 2 View  Result Date: 12/27/2020 CLINICAL DATA:  Left-sided chest pain.  EXAM: CHEST - 2 VIEW COMPARISON:  October 29, 2017 FINDINGS: Mildly increased lung markings are seen without evidence of focal consolidation, pleural effusion or pneumothorax. The heart size and mediastinal contours are within normal limits. S-shaped scoliosis of the thoracolumbar spine is noted. IMPRESSION: No active cardiopulmonary disease. Electronically Signed   By: Virgina Norfolk M.D.   On: 12/27/2020 19:27    ____________________________________________   PROCEDURES  Procedure(s) performed (including Critical Care):  .1-3 Lead EKG Interpretation Performed by: Tamala Julian,  Ida Rogue, MD Authorized by: Lucrezia Starch, MD     Interpretation: normal     ECG rate assessment: normal     Rhythm: sinus rhythm     Ectopy: none     Conduction: normal       ____________________________________________   INITIAL IMPRESSION / ASSESSMENT AND PLAN / ED COURSE  Patient presents with above-stated history and exam for assessment of some chest tightness associate with shortness of breath that started earlier this afternoon.  She stated did improve with some nitroglycerin.  She did receive 224 mg of ASA prior to arrival.  On arrival she is afebrile and hemodynamically stable.  Primary differential includes ACS, PE, arrhythmia, pneumonia, pneumothorax, esophageal spasm, metabolic derangements and anemia.  Chest x-ray has no evidence of pneumonia, effusion, edema, pneumothorax or any other clear acute intrathoracic process.  Overall I have very low suspicion for PE as patient has no hypoxia, tachycardia, tachypnea, or other clear recent historical or exam features concerning for this i.e. recent surgeries, hemoptysis, estrogen supplementation or any findings of asymmetric lower extremity edema.  ECG shows evidence of WPW with Q wave in 1-lead although given nonelevated troponin findings x2 Evalose patient for ACS or myocarditis.  BMP shows no significant electrolyte or metabolic derangements.  CBC  shows WBC count of 15.2 somewhat nonspecific but no acute anemia.  Exam is unremarkable.   Patient given below analgesia on reassessment states she had no chest pain at all.  Given resolution of chest pain with otherwise stable vitals and reassuring exam I think she is safe for discharge with outpatient cardiology follow-up given nonspecific findings on ECG.  Patient discharged stable condition.  Strict return precautions advised and discussed.  Counseled to decrease her tobacco use..    ____________________________________________   FINAL CLINICAL IMPRESSION(S) / ED DIAGNOSES  Final diagnoses:  Nonspecific chest pain  WPW (Wolff-Parkinson-White syndrome)  Tobacco abuse    Medications  nitroGLYCERIN (NITROSTAT) SL tablet 0.4 mg (has no administration in time range)  morphine 4 MG/ML injection 4 mg (4 mg Intravenous Given 12/27/20 2116)     ED Discharge Orders    None       Note:  This document was prepared using Dragon voice recognition software and may include unintentional dictation errors.   Lucrezia Starch, MD 12/27/20 2207

## 2020-12-27 NOTE — ED Triage Notes (Addendum)
Pt comes with c/o left sided CP that started around 5pm today. Pt took aspirin at home. Pt given 3 nitro by EMs.  Pt has nitro paste on left upper chest as well. Pt states current pain is 4/10. Pt states little SOB

## 2020-12-27 NOTE — ED Triage Notes (Signed)
Pt comes into the ED via EMS from home with c/o sudden onset left sided chest pain while eating dinner around 5pm, took ASA 325mg  at home 3 nitro BL 1in nitro paste #[id number] 134/96 72HR [id number] 98.4 temp

## 2021-05-09 ENCOUNTER — Other Ambulatory Visit: Payer: Self-pay

## 2021-05-09 ENCOUNTER — Ambulatory Visit: Payer: Self-pay | Admitting: Physician Assistant

## 2021-05-09 DIAGNOSIS — Z113 Encounter for screening for infections with a predominantly sexual mode of transmission: Secondary | ICD-10-CM

## 2021-05-09 DIAGNOSIS — A5901 Trichomonal vulvovaginitis: Secondary | ICD-10-CM

## 2021-05-09 LAB — WET PREP FOR TRICH, YEAST, CLUE
Clue Cell Exam: POSITIVE — AB
Trichomonas Exam: POSITIVE — AB
Yeast Exam: NEGATIVE

## 2021-05-09 MED ORDER — METRONIDAZOLE 500 MG PO TABS: 500.0000 mg | ORAL_TABLET | Freq: Two times a day (BID) | ORAL | 0 refills | Status: AC

## 2021-05-10 ENCOUNTER — Encounter: Payer: Self-pay | Admitting: Physician Assistant

## 2021-05-10 NOTE — Progress Notes (Signed)
Chart reviewed by Pharmacist  Suzanne Walker PharmD, Contract Pharmacist at  [facility]  

## 2021-05-10 NOTE — Progress Notes (Signed)
[facility] Department STI clinic/screening visit  Subjective:  Alexandra Stevenson is a 50 y.o. female being seen today for an STI screening visit. The patient reports they do have symptoms.  Patient reports that they do not desire a pregnancy in the next year.   They reported they are not interested in discussing contraception today.  No LMP recorded. (Menstrual status: Perimenopausal).   Patient has the following medical conditions:   Patient Active Problem List   Diagnosis Date Noted   Fatigue 02/22/2013   Leukocytosis 02/22/2013   WPW (Wolff-Parkinson-White syndrome) 12/31/2012    Chief Complaint  Patient presents with   SEXUALLY TRANSMITTED DISEASE    screening    HPI  Patient reports that she has had a creamy discharge that has been getting worse for the last 2 weeks.  Denies other symptoms.   Denies chronic conditions and regular medicines and states that her surgeries should be in her chart.  States last HIV test was 3-4 years ago and her last pap was last year.   See flowsheet for further details and programmatic requirements.    The following portions of the patient's history were reviewed and updated as appropriate: allergies, current medications, past medical history, past social history, past surgical history and problem list.  Objective:  There were no vitals filed for this visit.  Physical Exam Constitutional:      General: She is not in acute distress.    Appearance: Normal appearance.  HENT:     Head: Normocephalic and atraumatic.     Comments: No nits,lice, or hair loss. No cervical, supraclavicular or axillary adenopathy.     Mouth/Throat:     Mouth: Mucous membranes are moist.     Pharynx: Oropharynx is clear. No oropharyngeal exudate or posterior oropharyngeal erythema.  Eyes:     Conjunctiva/sclera: Conjunctivae normal.  Pulmonary:     Effort: Pulmonary effort is normal.  Abdominal:     Palpations: Abdomen is soft. There is no mass.      Tenderness: There is no abdominal tenderness. There is no guarding or rebound.  Genitourinary:    General: Normal vulva.     Rectum: Normal.     Comments: External genitalia/pubic area without nits, lice, edema, erythema, lesions and inguinal adenopathy. Vagina with normal mucosa and moderate amount of thick yellowish discharge. Cervix without visible lesions. Uterus firm, mobile, nt, no masses, no CMT, no adnexal tenderness or fullness.  Musculoskeletal:     Cervical back: Neck supple. No tenderness.  Skin:    General: Skin is warm and dry.     Findings: No bruising, erythema, lesion or rash.  Neurological:     Mental Status: She is alert and oriented to person, place, and time.  Psychiatric:        Mood and Affect: Mood normal.        Behavior: Behavior normal.        Thought Content: Thought content normal.        Judgment: Judgment normal.     Assessment and Plan:  Alexandra Stevenson is a 50 y.o. female presenting to the [facility] Department for STI screening  1. Screening for STD (sexually transmitted disease) Patient into clinic with symptoms.  Reviewed with patient wet mount results. Rec condoms with all sex. Await test results.  Counseled that RN will call if needs to RTC for treatment once results are back.  - WET PREP FOR [place], YEAST, [place]  STATE LAB - Syphilis Serology, [place] [facility]  2. Trichomonal vulvovaginitis Treat for Trich with Metronidazole 500 mg #14 1 po BID for 7 days with food, no EtOH for 24 hr before and until 72 hr after completing medicine. No sex for 14 days and until after partner completes treatment. Enc to use OTC antifungal cream if has itching during or just after antibiotic use.  - metroNIDAZOLE (FLAGYL) 500 MG tablet; Take 1 tablet (500 mg total) by mouth 2 (two) times daily for 7 days.  Dispense: 14 tablet; Refill: 0     No follow-ups on file.  No future appointments.  Jerene Dilling, PA

## 2021-05-22 ENCOUNTER — Other Ambulatory Visit: Payer: Self-pay

## 2021-05-22 ENCOUNTER — Encounter: Payer: Self-pay | Admitting: Emergency Medicine

## 2021-05-22 ENCOUNTER — Ambulatory Visit
Admission: EM | Admit: 2021-05-22 | Discharge: 2021-05-22 | Disposition: A | Discharge status: Home / Self Care | Payer: Self-pay | Attending: Physician Assistant | Admitting: Physician Assistant

## 2021-05-22 DIAGNOSIS — B349 Viral infection, unspecified: Secondary | ICD-10-CM

## 2021-05-22 DIAGNOSIS — H9203 Otalgia, bilateral: Secondary | ICD-10-CM

## 2021-05-22 DIAGNOSIS — J45901 Unspecified asthma with (acute) exacerbation: Secondary | ICD-10-CM

## 2021-05-22 MED ORDER — ALBUTEROL SULFATE HFA 108 (90 BASE) MCG/ACT IN AERS: 1.0000 | INHALATION_SPRAY | Freq: Four times a day (QID) | RESPIRATORY_TRACT | 0 refills | Status: DC | PRN

## 2021-05-22 MED ORDER — PREDNISONE 20 MG PO TABS: 40.0000 mg | ORAL_TABLET | Freq: Every day | ORAL | 0 refills | Status: AC

## 2021-05-22 NOTE — ED Provider Notes (Signed)
[facility]    CSN: [id number] Arrival date & time: 05/22/21  1401      History   Chief Complaint Chief Complaint  Patient presents with   Otalgia    HPI Alexandra Stevenson is a 50 y.o. female presenting for 2 to 3-day history of bilateral ear pain/pressure, nasal congestion/runny nose and cough.  She denies any fever, fatigue, body aches, chest pain or breathing difficulty.  Patient has history of asthma but denies any chest tightness or breathing problem or wheezing.  She denies any sick contacts or known exposure to COVID-19 and does not want to be tested for COVID-19 today.  Patient has tried over-the-counter eardrops without improvement in her ear complaints.  She has no other complaints.  HPI  Past Medical History:  Diagnosis Date   Asthma    Cancer ([place]) 1990   cervical   Stroke ([place])    x3   Syncope and collapse    TIA (transient ischemic attack)    x 3   WPW (Wolff-Parkinson-White syndrome) 12/31/2012    Patient Active Problem List   Diagnosis Date Noted   Fatigue 02/22/2013   Leukocytosis 02/22/2013   WPW (Wolff-Parkinson-White syndrome) 12/31/2012    Past Surgical History:  Procedure Laterality Date   CERVICAL BIOPSY  1990    OB History   No obstetric history on file.      Home Medications    Prior to Admission medications   Medication Sig Start Date End Date Taking? Authorizing Provider  predniSONE (DELTASONE) 20 MG tablet Take 2 tablets (40 mg total) by mouth daily for 5 days. 05/22/21 05/27/21 Yes Danton Clap, PA-C  albuterol (VENTOLIN HFA) 108 (90 Base) MCG/ACT inhaler Inhale 1-2 puffs into the lungs every 6 (six) hours as needed for wheezing or shortness of breath. 05/22/21   Danton Clap, PA-C  ibuprofen (ADVIL,MOTRIN) 200 MG tablet Take 200 mg by mouth every 6 (six) hours as needed for pain.    [provider]    Family History History reviewed. No pertinent family history.  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  HENT:  Positive for congestion, ear pain and sinus pressure. Negative for ear discharge, hearing loss, rhinorrhea, sinus pain and sore throat.   Respiratory:  Positive for cough. Negative for shortness of breath.   Gastrointestinal:  Negative for abdominal pain, nausea and vomiting.  Musculoskeletal:  Negative for arthralgias and myalgias.  Skin:  Negative for rash.  Neurological:  Negative for weakness and headaches.  Hematological:  Negative for adenopathy.    Physical Exam Triage Vital Signs ED Triage Vitals  Enc Vitals Group     BP 05/22/21 1444 (!) 137/103     Pulse Rate 05/22/21 1444 86     Resp 05/22/21 1444 16     Temp 05/22/21 1444 98.3 F (36.8 C)  Temp Source 05/22/21 1444 Oral     SpO2 05/22/21 1444 95 %     Weight --      Height --      Head Circumference --      Peak Flow --      Pain Score 05/22/21 1440 3     Pain Loc --      Pain Edu? --      Excl. in [place]? --    No data found.  Updated Vital Signs BP (!) 137/103 (BP Location: Right Arm)   Pulse 86   Temp 98.3 F (36.8 C) (Oral)   Resp 16   LMP 10/26/2017 (Exact Date) Comment: ncp  SpO2 95%      Physical Exam Vitals and nursing note reviewed.  Constitutional:      General: She is not in acute distress.    Appearance: Normal appearance. She is not ill-appearing or toxic-appearing.  HENT:     Head: Normocephalic and atraumatic.     Right Ear: A middle ear effusion is present. Tympanic  membrane is injected.     Left Ear: A middle ear effusion is present. Tympanic membrane is injected.     Nose: Congestion present.     Mouth/Throat:     Mouth: Mucous membranes are moist.     Pharynx: Oropharynx is clear.  Eyes:     General: No scleral icterus.       Right eye: No discharge.        Left eye: No discharge.     Conjunctiva/sclera: Conjunctivae normal.  Cardiovascular:     Rate and Rhythm: Normal rate and regular rhythm.     Heart sounds: Normal heart sounds.  Pulmonary:     Effort: Pulmonary effort is normal. No respiratory distress.     Breath sounds: Wheezing (diffuse wheezing) present.  Musculoskeletal:     Cervical back: Neck supple.  Skin:    General: Skin is dry.  Neurological:     General: No focal deficit present.     Mental Status: She is alert. Mental status is at baseline.     Motor: No weakness.     Gait: Gait normal.  Psychiatric:        Mood and Affect: Mood normal.        Behavior: Behavior normal.        Thought Content: Thought content normal.     UC Treatments / Results  Labs (all labs ordered are listed, but only abnormal results are displayed) Labs Reviewed - No data to display  EKG   Radiology No results found.  Procedures Procedures (including critical care time)  Medications Ordered in UC Medications - No data to display  Initial Impression / Assessment and Plan / UC Course  I have reviewed the triage vital signs and the nursing notes.  Pertinent labs & imaging results that were available during my care of the patient were reviewed by me and considered in my medical decision making (see chart for details).  50 year old female presenting for 2 to 3-day history of bilateral ear pain/pressure, sinus pressure, nasal congestion and cough.  On exam patient has effusions and mild injection of bilateral TMs but no evidence of bacterial infection.  Advised her of this and advised her symptoms is due to viral illness.  Suggested  COVID testing but she says she will take a test at home.  Reviewed current CDC guidelines, isolation protocol and ED precautions if COVID home test is positive.  Advised  over-the-counter Mucinex D or Sudafed and Flonase.  Advised increasing rest and fluids.  On exam she does have diffuse wheezing throughout chest.  Patient says she has asthma but does not have an inhaler.  I have sent in albuterol inhaler and prednisone.  Reviewed return and ED precautions.  Final Clinical Impressions(s) / UC Diagnoses   Final diagnoses:  Acute ear pain, bilateral  Viral illness  Asthma with acute exacerbation, unspecified asthma severity, unspecified whether persistent     Discharge Instructions      -Your ears are not infected.  Start Sudafed or Mucinex D and increase fluid intake.  Also start using Flonase and nasal saline/sinus rinses. -You have wheezing so your asthma is flared up.  Use the inhaler 1 to 2 puffs every 4-6 hours as needed for wheezing and start the prednisone. -Taken at home COVID test.  If you are positive used to be isolated 5 days and then wear mask for 5 days. -If the COVID test is negative you can work that you should wear a mask and make sure to wash your hands regularly. -For any worsening symptoms you should be seen again.     ED Prescriptions     Medication Sig Dispense Auth. Provider   albuterol (VENTOLIN HFA) 108 (90 Base) MCG/ACT inhaler Inhale 1-2 puffs into the lungs every 6 (six) hours as needed for wheezing or shortness of breath. 1 g Laurene Footman B, PA-C   predniSONE (DELTASONE) 20 MG tablet Take 2 tablets (40 mg total) by mouth daily for 5 days. 10 tablet Gretta Cool      PDMP not reviewed this encounter.   Danton Clap, PA-C 05/22/21 1554

## 2021-05-22 NOTE — ED Triage Notes (Signed)
Pt presents today with c/o of bilateral ear pain x 2-3 days. She also c/o of sinus drainage. Denies fever.  *Declines Covid testing today.

## 2021-05-22 NOTE — Discharge Instructions (Signed)
-  Your ears are not infected.  Start Sudafed or Mucinex D and increase fluid intake.  Also start using Flonase and nasal saline/sinus rinses. -You have wheezing so your asthma is flared up.  Use the inhaler 1 to 2 puffs every 4-6 hours as needed for wheezing and start the prednisone. -Taken at home COVID test.  If you are positive used to be isolated 5 days and then wear mask for 5 days. -If the COVID test is negative you can work that you should wear a mask and make sure to wash your hands regularly. -For any worsening symptoms you should be seen again.

## 2021-10-17 ENCOUNTER — Encounter: Payer: Self-pay | Admitting: Advanced Practice Midwife

## 2021-10-17 ENCOUNTER — Other Ambulatory Visit: Payer: Self-pay

## 2021-10-17 ENCOUNTER — Ambulatory Visit: Payer: Self-pay | Admitting: Advanced Practice Midwife

## 2021-10-17 DIAGNOSIS — T7421XS Adult sexual abuse, confirmed, sequela: Secondary | ICD-10-CM

## 2021-10-17 DIAGNOSIS — A599 Trichomoniasis, unspecified: Secondary | ICD-10-CM

## 2021-10-17 DIAGNOSIS — T7421XA Adult sexual abuse, confirmed, initial encounter: Secondary | ICD-10-CM | POA: Insufficient documentation

## 2021-10-17 DIAGNOSIS — T7411XS Adult physical abuse, confirmed, sequela: Secondary | ICD-10-CM

## 2021-10-17 DIAGNOSIS — Z62810 Personal history of physical and sexual abuse in childhood: Secondary | ICD-10-CM

## 2021-10-17 DIAGNOSIS — F17200 Nicotine dependence, unspecified, uncomplicated: Secondary | ICD-10-CM

## 2021-10-17 DIAGNOSIS — T7411XA Adult physical abuse, confirmed, initial encounter: Secondary | ICD-10-CM | POA: Insufficient documentation

## 2021-10-17 DIAGNOSIS — Z720 Tobacco use: Secondary | ICD-10-CM | POA: Insufficient documentation

## 2021-10-17 DIAGNOSIS — Z113 Encounter for screening for infections with a predominantly sexual mode of transmission: Secondary | ICD-10-CM

## 2021-10-17 LAB — WET PREP FOR TRICH, YEAST, CLUE
Trichomonas Exam: POSITIVE — AB
Yeast Exam: NEGATIVE

## 2021-10-17 MED ORDER — METRONIDAZOLE 500 MG PO TABS: 500.0000 mg | ORAL_TABLET | Freq: Two times a day (BID) | ORAL | 0 refills | Status: AC

## 2021-10-17 NOTE — Progress Notes (Signed)
Patient seen for STD testing. BCCCP # and information given to patient. Wet prep reviewed. Providers orders completed. Condoms given.  ?

## 2021-10-17 NOTE — Addendum Note (Signed)
Addended by: Jenetta Downer on: 10/17/2021 02:17 PM ? ? Modules accepted: Orders ? ?

## 2021-10-17 NOTE — Progress Notes (Signed)
Wet mount reviewed, patient treated for Trich per SO.Marland KitchenJenetta Downer, RN  ?

## 2021-10-17 NOTE — Progress Notes (Signed)
[facility] Department ? ?STI clinic/screening visit ?[place] [address] ?[phone] ? ?Subjective:  ?Alexandra Stevenson is a 51 y.o. DWF G6P4 smoker female being seen today for an STI screening visit. The patient reports they do have symptoms.  Patient reports that they do not desire a pregnancy in the next year.   They reported they are not interested in discussing contraception today.   ? ?Patient's last menstrual period was 10/26/2017 (exact date). ? ? ?Patient has the following medical conditions:   ?Patient Active Problem List  ? Diagnosis Date Noted  ? Fatigue 02/22/2013  ? Leukocytosis 02/22/2013  ? WPW (Wolff-Parkinson-White syndrome) 12/31/2012  ? ? ?Chief Complaint  ?Patient presents with  ? SEXUALLY TRANSMITTED DISEASE  ? ? ?HPI ? ?Patient reports c/o increased creamy yellow d/c and found out 2 wks ago that partner cheated on her. Last sex 10/14/21 without condom; with current partner x 4 years; 1 sex partner in last 3 mo. LMP 5 years ago. Last MJ 2022. Last ETOH 10/12/21 (4 beers+2 glasses wine) qo weekend. Smoking 1/2 ppd. Vaping. Last cigar 3 mo ago.  ? ?Last HIV test per patient/review of record was 05/09/21 ?Patient reports last pap was 10 years ago ? ?Screening for MPX risk: ?Does the patient have an unexplained rash? No ?Is the patient MSM? No ?Does the patient endorse multiple sex partners or anonymous sex partners? No ?Did the patient have close or sexual contact with a person diagnosed with MPX? No ?Has the patient traveled outside the [place] where MPX is endemic? No ?Is there a high clinical suspicion for MPX-- evidenced by one of the following No ? -Unlikely to be chickenpox ? -Lymphadenopathy ? -Rash that present in same phase of evolution on any given body part ?See flowsheet for further details and programmatic requirements.  ? ? ?The following portions of the patient's history were reviewed and updated as appropriate: allergies, current medications, past medical  history, past social history, past surgical history and problem list. ? ?Objective:  ?There were no vitals filed for this visit. ? ?Physical Exam ?Vitals and nursing note reviewed.  ?Constitutional:   ?   Appearance: Normal appearance.  ?HENT:  ?   Head: Normocephalic and atraumatic.  ?   Mouth/Throat:  ?   Mouth: Mucous membranes are moist.  ?   Pharynx: Oropharynx is clear. No oropharyngeal exudate or posterior oropharyngeal erythema.  ?Eyes:  ?   Conjunctiva/sclera: Conjunctivae normal.  ?Pulmonary:  ?   Effort: Pulmonary effort is normal.  ?Abdominal:  ?   Palpations: Abdomen is soft. There is no mass.  ?   Tenderness: There is no abdominal tenderness. There is no rebound.  ?   Comments: Soft, poor tone, without masses or tenderness  ?Genitourinary: ?   General: Normal vulva.  ?   Exam position: Lithotomy position.  ?   Pubic Area: No rash or pubic lice.   ?   Labia:     ?   Right: No rash or lesion.     ?   Left: No rash or lesion.   ?   Vagina: Vaginal discharge (large amt creamy light green leukorrhea, ph>4.5) present. No erythema, bleeding or lesions.  ?   Cervix: Normal.  ?   Uterus: Normal.   ?   Adnexa: Right adnexa normal and left adnexa normal.  ?   Rectum: Normal.  ?Lymphadenopathy:  ?   Head:  ?   Right side of head: No  preauricular or posterior auricular adenopathy.  ?   Left side of head: No preauricular or posterior auricular adenopathy.  ?   Cervical: No cervical adenopathy.  ?   Right cervical: No superficial, deep or posterior cervical adenopathy. ?   Left cervical: No superficial, deep or posterior cervical adenopathy.  ?   Upper Body:  ?   Right upper body: No supraclavicular or axillary adenopathy.  ?   Left upper body: No supraclavicular or axillary adenopathy.  ?   Lower Body: No right inguinal adenopathy. No left inguinal adenopathy.  ?Skin: ?   General: Skin is warm and dry.  ?   Findings: No rash.  ?Neurological:  ?   Mental Status: She is alert and oriented to person, place, and time.   ? ? ? ?Assessment and Plan:  ?Alexandra Stevenson is a 51 y.o. female presenting to the [facility] Department for STI screening ? ?1. Screening examination for venereal disease ?Please give pt BCCCP # for pap and mammogram ?Treat wet mount per standing orders ?Immunization nurse consult ? ?- WET PREP FOR TRICH, YEAST, CLUE ?- Chlamydia/Gonorrhea [facility] ? ? ? ? ?Return if symptoms worsen or fail to improve. ? ?No future appointments. ? ?Herbie Saxon, CNM ?

## 2021-11-04 IMAGING — CT CT HEAD W/O CM
3 series · 15 of 45 positions shown, 18 images · non-contrast
Comparison: None.

CLINICAL DATA: Transient ischemic attack. Facial droop. Right-sided
numbness. Slurred speech.

EXAM:
CT HEAD WITHOUT CONTRAST
TECHNIQUE: Contiguous axial images were obtained from the base of the skull
through the vertex without intravenous contrast.

[Series 3: head wo · axial · 0.39mm/px · z∈[-196,-81]mm · 9 of 28 slices shown, 12 images]
[im 3/28  brain]
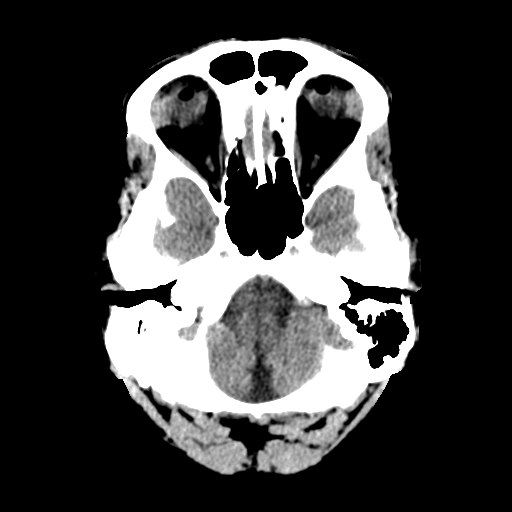
[im 3/28  bone]
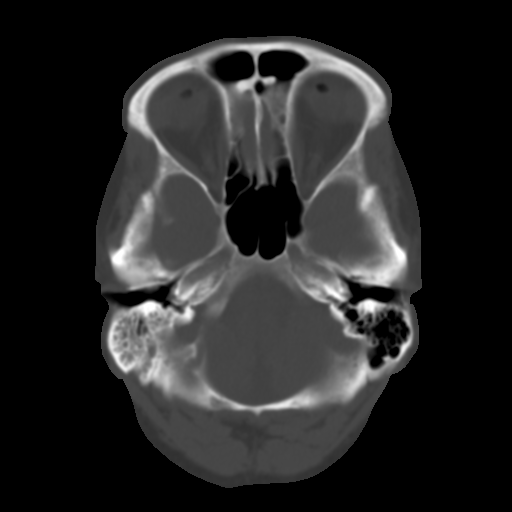
[im 6/28  brain]
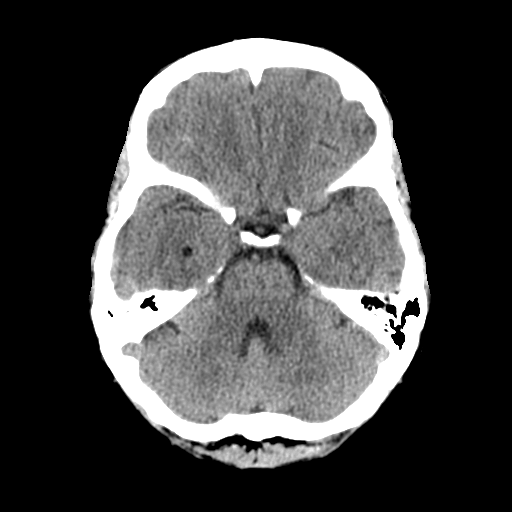
[im 9/28  brain]
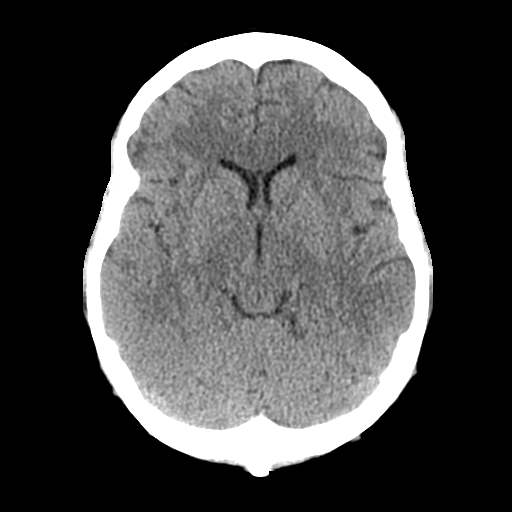
[im 12/28  brain]
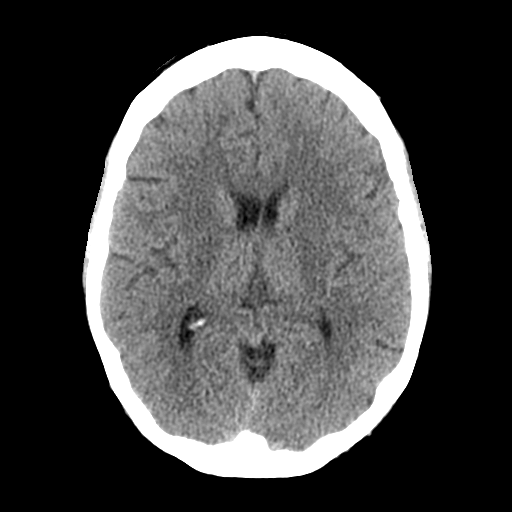
[im 15/28  brain]
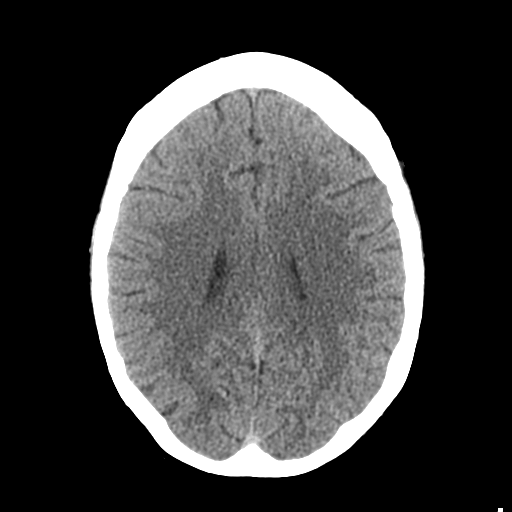
[im 15/28  bone]
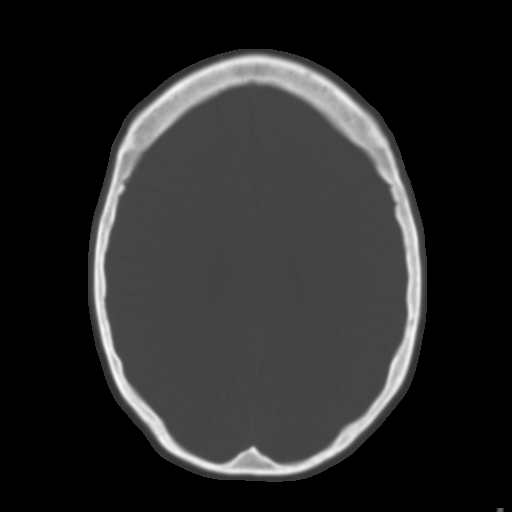
[im 17/28  brain]
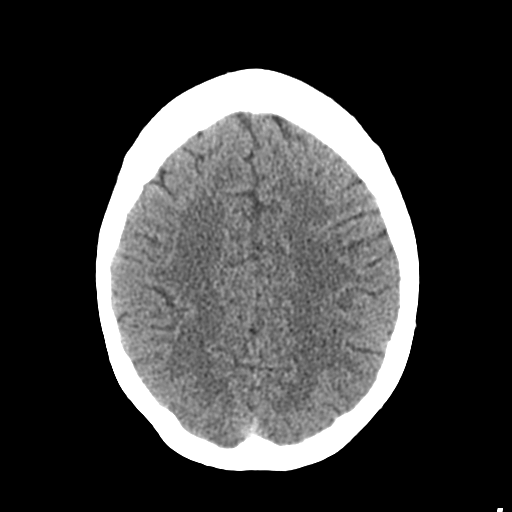
[im 20/28  brain]
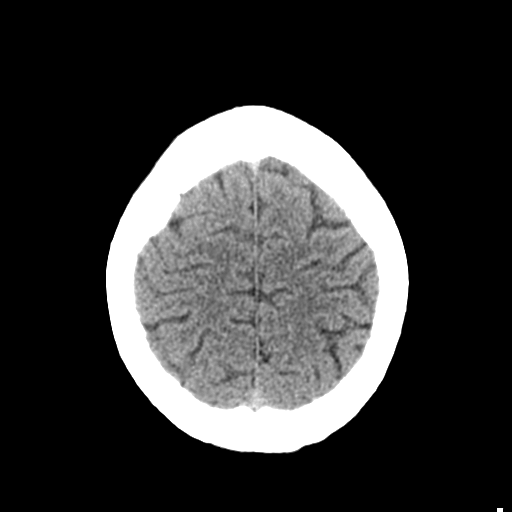
[im 23/28  brain]
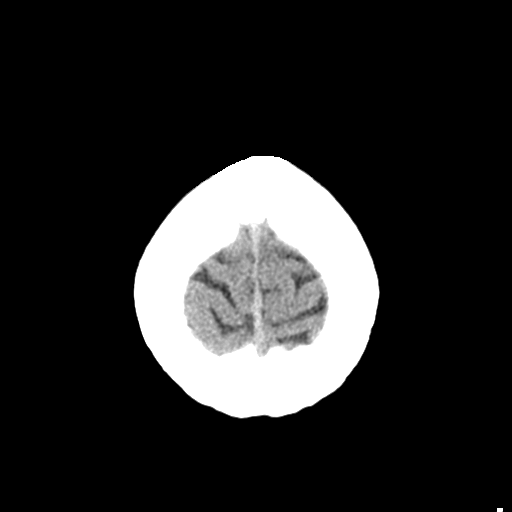
[im 26/28  brain]
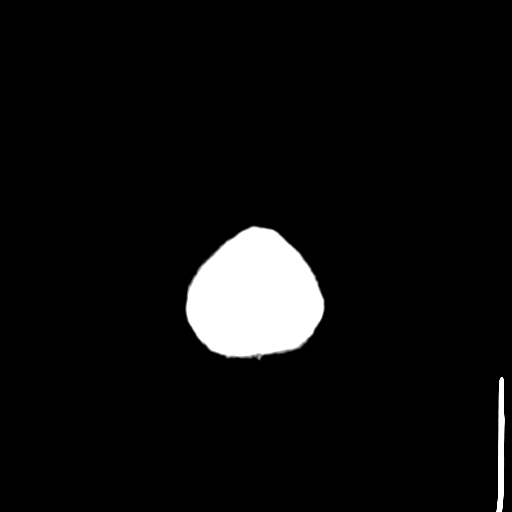
[im 26/28  bone]
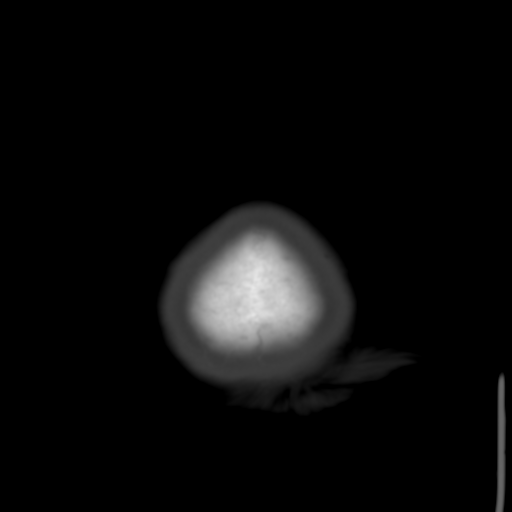

[Series 4: coronal soft tissue · coronal · 0.26mm/px · 3 of 62 slices shown]
[im 21/62  brain]
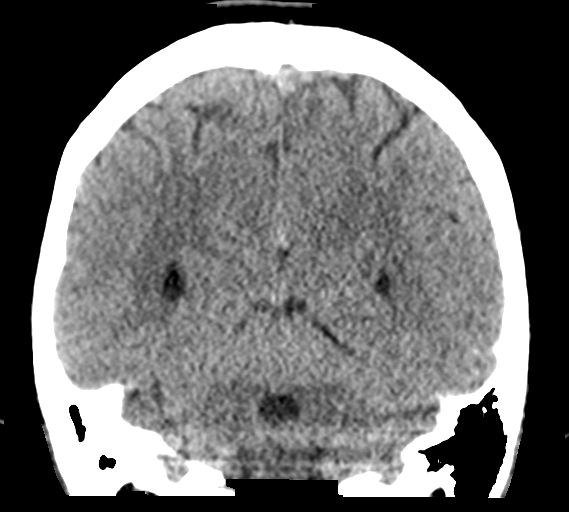
[im 28/62  brain]
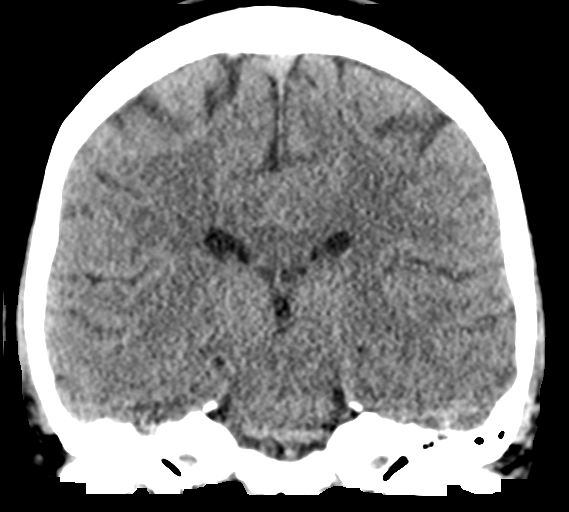
[im 34/62  brain]
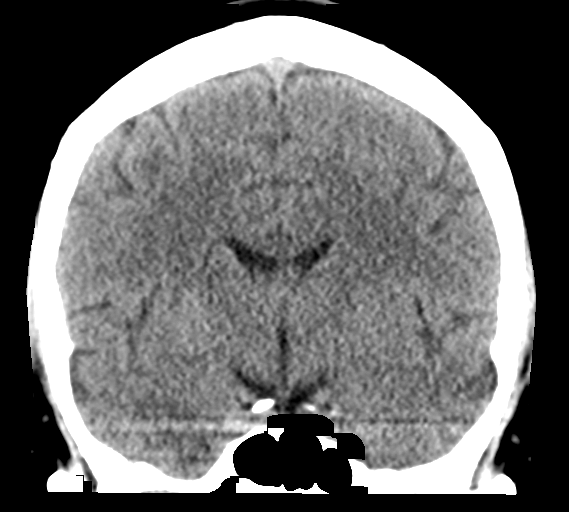

[Series 5: sagittal soft tissue · sagittal · 0.26mm/px · 3 of 51 slices shown]
[im 17/51  brain]
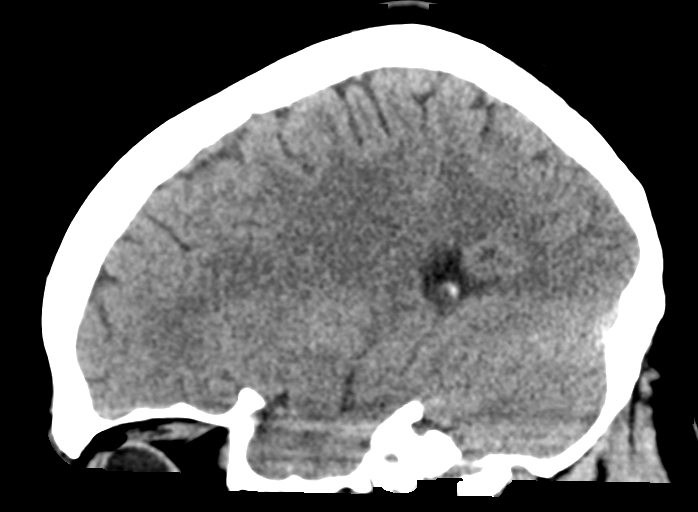
[im 26/51  brain]
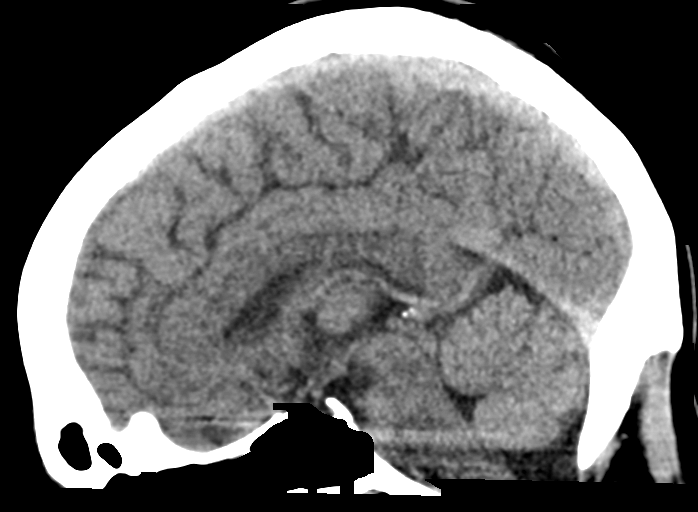
[im 34/51  brain]
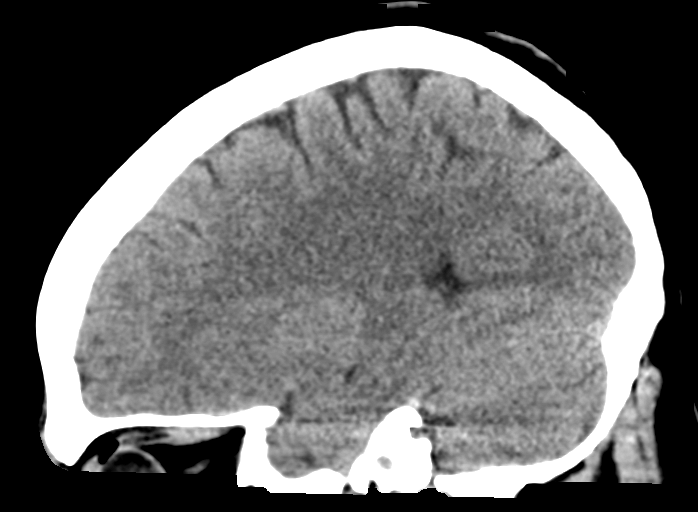

[15 of 45 positions shown; findings below may reference images not displayed]

FINDINGS: Brain: The brain shows a normal appearance without evidence of
malformation, atrophy, old or acute small or large vessel
infarction, mass lesion, hemorrhage, hydrocephalus or extra-axial
collection.

Vascular: No hyperdense vessel. No evidence of atherosclerotic
calcification.

Skull: Normal.  No traumatic finding.  No focal bone lesion.

Sinuses/Orbits: Sinuses are clear. Orbits appear normal. Mastoids
are clear.

Other: None significant
IMPRESSION: Normal head CT

## 2022-04-17 ENCOUNTER — Ambulatory Visit: Payer: Self-pay

## 2022-04-23 ENCOUNTER — Encounter: Payer: Self-pay | Admitting: Advanced Practice Midwife

## 2022-04-23 ENCOUNTER — Ambulatory Visit: Payer: Self-pay | Admitting: Advanced Practice Midwife

## 2022-04-23 DIAGNOSIS — Z113 Encounter for screening for infections with a predominantly sexual mode of transmission: Secondary | ICD-10-CM

## 2022-04-23 LAB — WET PREP FOR TRICH, YEAST, CLUE
Trichomonas Exam: POSITIVE — AB
Yeast Exam: NEGATIVE

## 2022-04-23 MED ORDER — METRONIDAZOLE 500 MG PO TABS: 500.0000 mg | ORAL_TABLET | Freq: Two times a day (BID) | ORAL | 0 refills | Status: AC

## 2022-04-23 NOTE — Progress Notes (Signed)
Wet mount reviewed during clinic visit -Treatment given for trich per SO. Education given and questions answered.   Al Decant, RN
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
## 2022-04-27 LAB — GONOCOCCUS CULTURE

## 2022-04-29 ENCOUNTER — Telehealth: Payer: Self-pay | Admitting: Family Medicine

## 2022-04-29 NOTE — Telephone Encounter (Signed)
Pt received the results in MyChart and wants someone from the clinic to call her back to discuss them and schedule a treatment appt as soon as possible.

## 2022-04-29 NOTE — Telephone Encounter (Signed)
Pt states that her results for Gonorrhea were positive and she wants treatment.  Explained to pt that her Gonococcus culture from her pharynx was negative and that we were still waiting on her Chlamydia/Gonorrhea (vaginal) results.

## 2022-05-22 ENCOUNTER — Emergency Department
Admission: EM | Admit: 2022-05-22 | Discharge: 2022-05-22 | Discharge status: Against Medical Advice | Payer: No Typology Code available for payment source | Attending: Emergency Medicine | Admitting: Emergency Medicine

## 2022-05-22 ENCOUNTER — Emergency Department: Payer: No Typology Code available for payment source

## 2022-05-22 ENCOUNTER — Encounter: Payer: Self-pay | Admitting: Emergency Medicine

## 2022-05-22 DIAGNOSIS — F17200 Nicotine dependence, unspecified, uncomplicated: Secondary | ICD-10-CM | POA: Insufficient documentation

## 2022-05-22 DIAGNOSIS — Z5321 Procedure and treatment not carried out due to patient leaving prior to being seen by health care provider: Secondary | ICD-10-CM | POA: Diagnosis not present

## 2022-05-22 DIAGNOSIS — R0789 Other chest pain: Secondary | ICD-10-CM | POA: Diagnosis present

## 2022-05-22 DIAGNOSIS — J45909 Unspecified asthma, uncomplicated: Secondary | ICD-10-CM | POA: Diagnosis not present

## 2022-05-22 LAB — BASIC METABOLIC PANEL
Anion gap: 9 (ref 5–15)
BUN: 16 mg/dL (ref 6–20)
CO2: 23 mmol/L (ref 22–32)
Calcium: 8.9 mg/dL (ref 8.9–10.3)
Chloride: 108 mmol/L (ref 98–111)
Creatinine, Ser: 0.73 mg/dL (ref 0.44–1.00)
GFR, Estimated: >60 mL/min (ref >60)
Glucose, Bld: 112 mg/dL — ABNORMAL HIGH (ref 70–99)
Potassium: 3.7 mmol/L (ref 3.5–5.1)
Sodium: 140 mmol/L (ref 135–145)

## 2022-05-22 LAB — CBC
HCT: 46.3 % — ABNORMAL HIGH (ref 36.0–46.0)
Hemoglobin: 15 g/dL (ref 12.0–15.0)
MCH: 27.4 pg (ref 26.0–34.0)
MCHC: 32.4 g/dL (ref 30.0–36.0)
MCV: 84.5 fL (ref 80.0–100.0)
Platelets: 398 10*3/uL (ref 150–400)
RBC: 5.48 MIL/uL — ABNORMAL HIGH (ref 3.87–5.11)
RDW: 15.6 % — ABNORMAL HIGH (ref 11.5–15.5)
WBC: 16.5 10*3/uL — ABNORMAL HIGH (ref 4.0–10.5)
nRBC: 0 % (ref 0.0–0.2)

## 2022-05-22 LAB — TROPONIN I (HIGH SENSITIVITY): Troponin I (High Sensitivity): 5 ng/L (ref <18)

## 2022-05-22 LAB — GROUP A STREP BY PCR: Group A Strep by PCR: NOT DETECTED

## 2022-05-22 NOTE — ED Notes (Signed)
No answer when called several times from lobby 

## 2022-05-22 NOTE — ED Triage Notes (Signed)
Pt presents via POV with complaints of left sided CP that started 1 hour ago. Hx of asthma and tobacco abuse. She notes taking 1 '81mg'$  ASA PTA with no improvement in her sx. Denies fevers, chills, N/V, SOB.

## 2022-05-22 NOTE — ED Notes (Signed)
No answer when called several times from lobby; no answer when phone # listed in chart called 

## 2023-01-30 ENCOUNTER — Ambulatory Visit: Payer: 59 | Admitting: Physician Assistant

## 2023-01-30 ENCOUNTER — Encounter: Payer: Self-pay | Admitting: Physician Assistant

## 2023-01-30 DIAGNOSIS — Z113 Encounter for screening for infections with a predominantly sexual mode of transmission: Secondary | ICD-10-CM

## 2023-01-30 LAB — WET PREP FOR TRICH, YEAST, CLUE
Trichomonas Exam: NEGATIVE
Yeast Exam: NEGATIVE

## 2023-01-30 LAB — HM HEPATITIS C SCREENING LAB: HM Hepatitis Screen: NEGATIVE

## 2023-01-30 LAB — HEPATITIS B SURFACE ANTIGEN: Hepatitis B Surface Ag: NONREACTIVE

## 2023-01-30 LAB — HM HIV SCREENING LAB: HM HIV Screening: NEGATIVE

## 2023-01-30 NOTE — Progress Notes (Signed)
Wet mount negative, reviewed with provider, no treatment indicated today.Burt Knack, RN

## 2023-01-30 NOTE — Progress Notes (Signed)
Here for STD screening.Meah Jiron Brewer-Jensen, RN 
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
## 2023-02-07 ENCOUNTER — Telehealth: Payer: Self-pay

## 2023-02-07 NOTE — Telephone Encounter (Signed)
Verified password, informed of + GC results. Appt schedule for treatment Monday, 02/10/2023.

## 2023-02-10 ENCOUNTER — Ambulatory Visit: Payer: 59

## 2023-02-10 VITALS — Wt 153.0 lb

## 2023-02-10 DIAGNOSIS — A549 Gonococcal infection, unspecified: Secondary | ICD-10-CM

## 2023-02-10 MED ORDER — CEFTRIAXONE SODIUM 500 MG IJ SOLR
500.0000 mg | Freq: Once | INTRAMUSCULAR | Status: AC
  Administered 2023-02-10: 500 mg via INTRAMUSCULAR

## 2023-02-10 NOTE — Progress Notes (Signed)
In nurse clinic for [facility] treatment.  See test results flowsheet for  instructions given.  Pt declined GC pamphlet and contact card; states partner aware of treatment needed.    Ceftriaxone 500 mg diluted with 1 ml Lidocaine administered IM left deltoid per SO by Dr. Karyl Kinnier; tolerated well.  Medication info sheet given and reviewed.  Appt reminder given to call for TOC appt in 3 months (05/13/23).  Advised to stay 15 minutes after med administered; no problem reported.  Cherlynn Polo, RN

## 2023-02-12 IMAGING — CR DG CHEST 2V
1 series · 2 of 2 positions shown · non-contrast
Comparison: October 29, 2017

CLINICAL DATA: Left-sided chest pain.

EXAM:
CHEST - 2 VIEW

[Series 1: dg chest 2 view · 0.14mm/px · 2 of 2 slices shown]
[im 1/2]
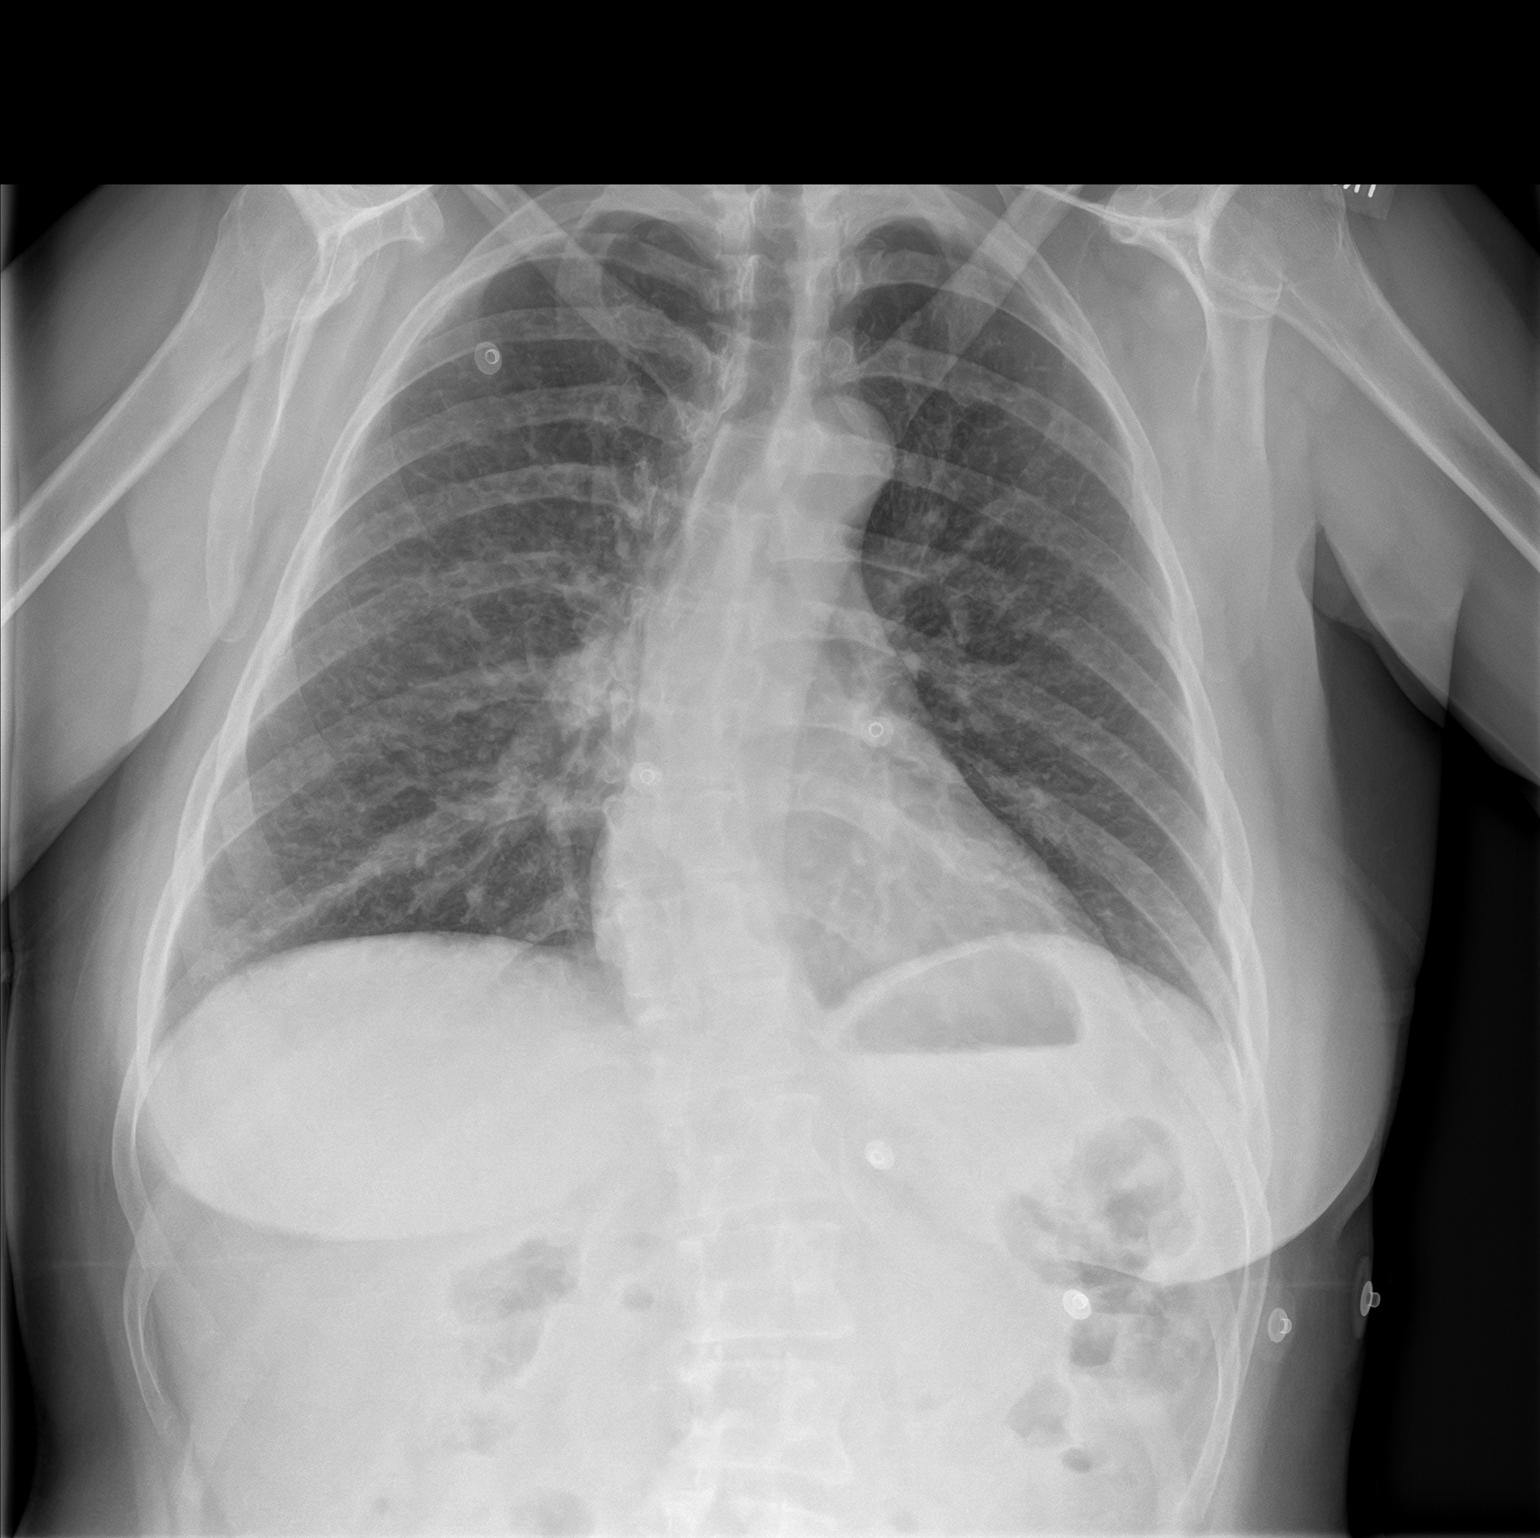
[im 2/2]
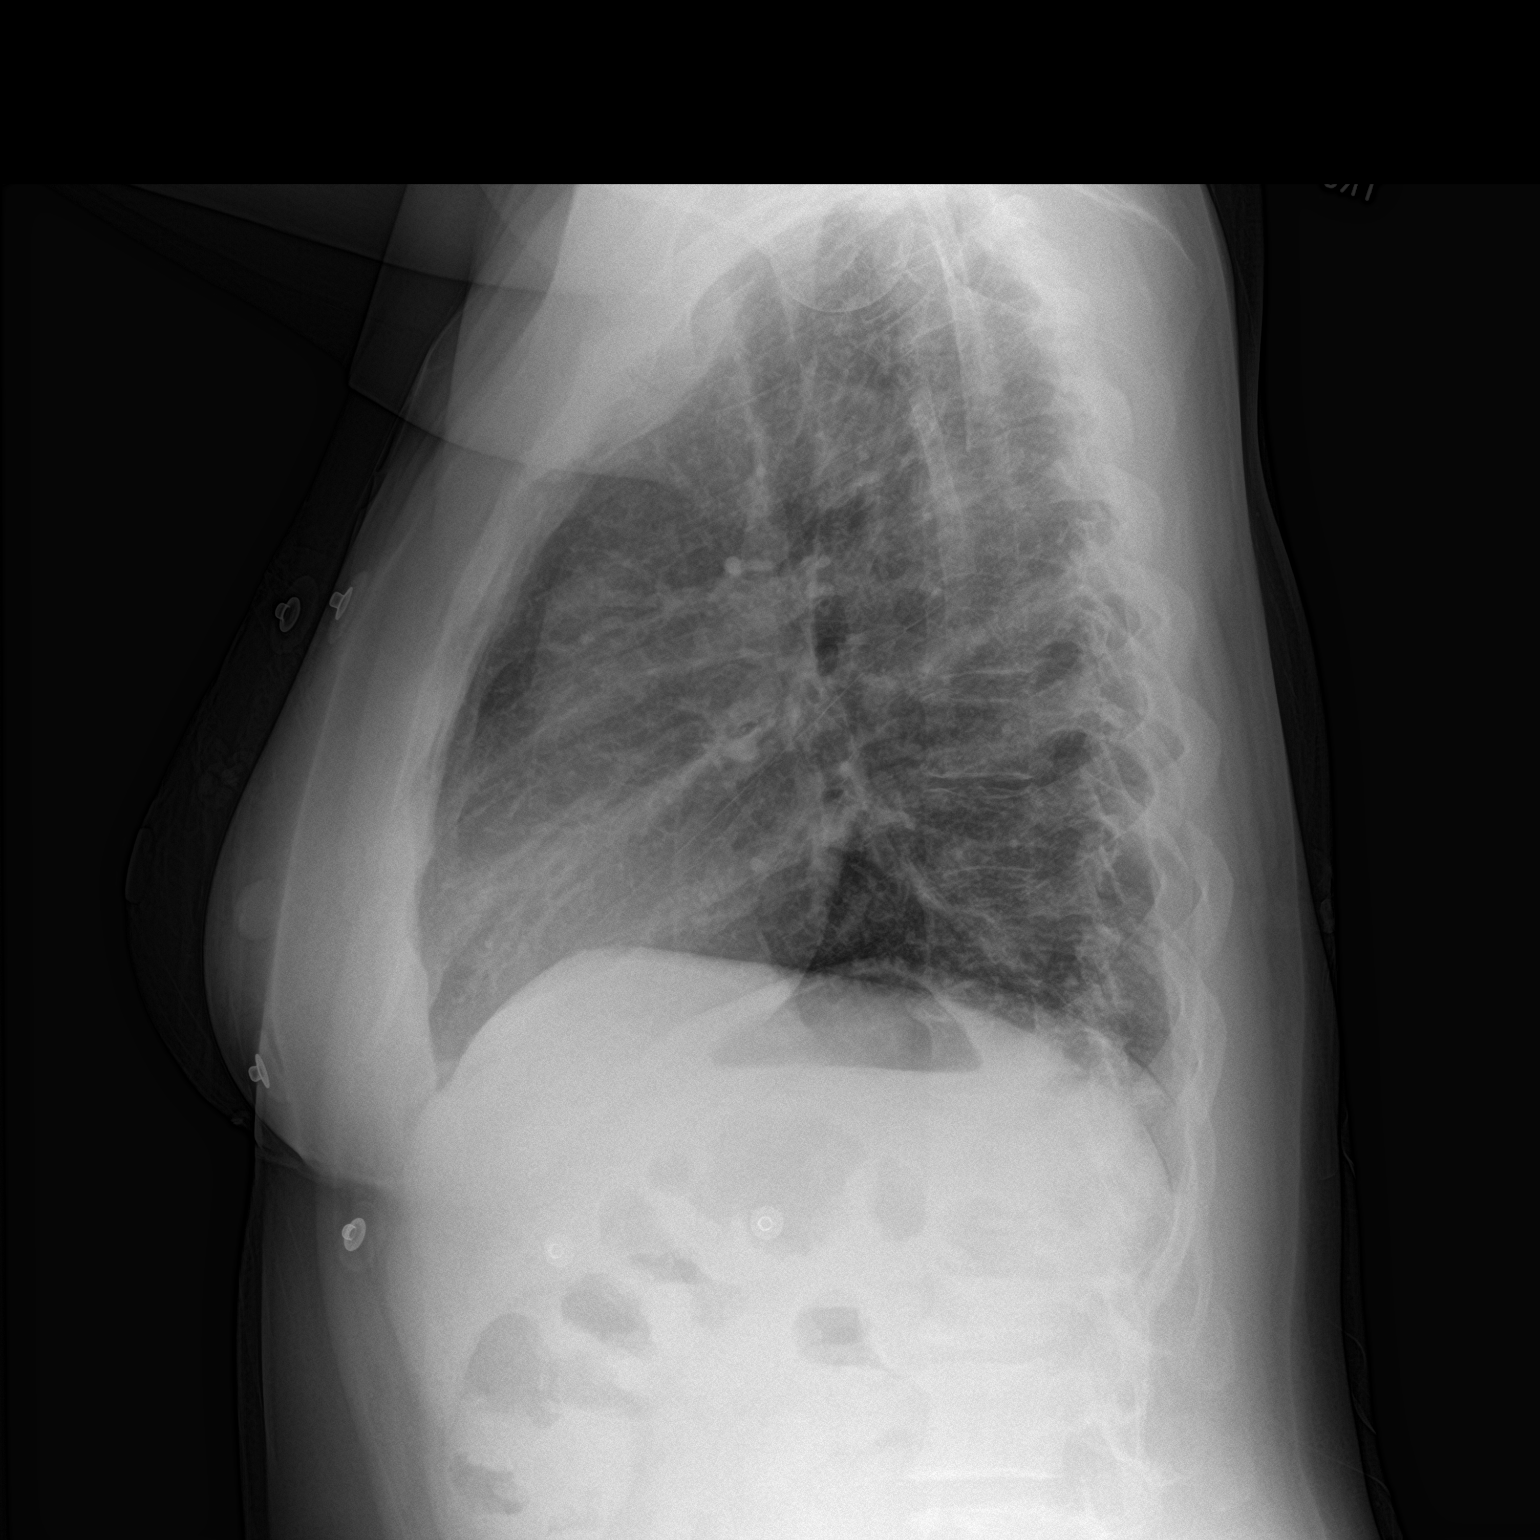

[2 of 2 positions shown; findings below may reference images not displayed]

FINDINGS: Mildly increased lung markings are seen without evidence of focal
consolidation, pleural effusion or pneumothorax. The heart size and
mediastinal contours are within normal limits. S-shaped scoliosis of
the thoracolumbar spine is noted.
IMPRESSION: No active cardiopulmonary disease.

## 2023-07-17 ENCOUNTER — Ambulatory Visit: Payer: 59

## 2023-12-04 ENCOUNTER — Ambulatory Visit

## 2023-12-04 ENCOUNTER — Other Ambulatory Visit: Payer: Self-pay | Admitting: Family Medicine

## 2023-12-04 DIAGNOSIS — Z113 Encounter for screening for infections with a predominantly sexual mode of transmission: Secondary | ICD-10-CM | POA: Diagnosis not present

## 2023-12-04 DIAGNOSIS — B379 Candidiasis, unspecified: Secondary | ICD-10-CM

## 2023-12-04 DIAGNOSIS — Z1231 Encounter for screening mammogram for malignant neoplasm of breast: Secondary | ICD-10-CM

## 2023-12-04 LAB — WET PREP FOR TRICH, YEAST, CLUE: Trichomonas Exam: NEGATIVE

## 2023-12-04 LAB — HM HEPATITIS C SCREENING LAB: HM Hepatitis Screen: NEGATIVE

## 2023-12-04 LAB — HM HIV SCREENING LAB: HM HIV Screening: NEGATIVE

## 2023-12-04 MED ORDER — CLOTRIMAZOLE 1 % VA CREA: 1.0000 | TOPICAL_CREAM | Freq: Every day | VAGINAL | Status: AC

## 2023-12-04 NOTE — Progress Notes (Signed)
 Orders for screening mammogram.   Tempie Fee, MD 12/04/23  3:14 PM
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
## 2023-12-04 NOTE — Progress Notes (Signed)
 Pt is here for STD screening , Wet prep results reviewed with pt, no treatment required per provider. Pt Condoms declined and brochure given. The patient was dispensed Clotrimazole  1% Vag cream for 7 days today. I provided counseling today regarding the medication. We discussed the medication, the side effects and when to call the clinic. Patient given the opportunity to ask questions for any clarifications. Questions answered. Austine Lefort, RN.

## 2023-12-04 NOTE — Patient Instructions (Addendum)
 STI screening - Today we obtained a vaginal swab to screen for gonorrhea, chlamydia, and trichomonas - We also obtained a blood sample to screen for HIV and syphilis - If the results are abnormal, I will give you a call.    Estimated time frame for results collected at the [facility] Department: Same day Trichomonas Yeast BV (bacterial vaginosis)  Within 1-2 weeks Gonorrhea Chlamydia  Within 2-3 weeks HIV Syphilis Hepatitis B Hepatitis C

## 2023-12-06 LAB — HBV ANTIGEN/ANTIBODY STATE LAB
Hep B Core Total Ab: NONREACTIVE
Hep B S Ab: NONREACTIVE
Hepatitis B Surface Antigen: NONREACTIVE

## 2023-12-24 ENCOUNTER — Encounter: Payer: Self-pay | Admitting: Family Medicine

## 2024-06-11 ENCOUNTER — Ambulatory Visit

## 2024-06-11 DIAGNOSIS — Z202 Contact with and (suspected) exposure to infections with a predominantly sexual mode of transmission: Secondary | ICD-10-CM

## 2024-06-11 DIAGNOSIS — Z113 Encounter for screening for infections with a predominantly sexual mode of transmission: Secondary | ICD-10-CM | POA: Diagnosis not present

## 2024-06-11 LAB — WET PREP FOR TRICH, YEAST, CLUE
Clue Cell Exam: NEGATIVE
Trichomonas Exam: NEGATIVE
Yeast Exam: NEGATIVE

## 2024-06-11 MED ORDER — CEFTRIAXONE SODIUM 500 MG IJ SOLR
500.0000 mg | Freq: Once | INTRAMUSCULAR | Status: AC
  Administered 2024-06-11: 500 mg via INTRAMUSCULAR
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
## 2024-06-11 NOTE — Progress Notes (Signed)
 Ceftriaxone  administered per provider order and tolerated injection without complaint. Counseled to remain at [facility] for 15 minutes for observation following injection. Per client, unable to do so as Gisele is waiting for her now. Burnadette Lowers, RN

## 2024-06-14 ENCOUNTER — Ambulatory Visit: Payer: Self-pay

## 2024-06-15 ENCOUNTER — Telehealth

## 2024-06-15 DIAGNOSIS — N898 Other specified noninflammatory disorders of vagina: Secondary | ICD-10-CM

## 2024-06-16 ENCOUNTER — Telehealth: Payer: Self-pay | Admitting: Family Medicine

## 2024-06-16 NOTE — Progress Notes (Signed)
 Advised patient to contact office previously seen at to follow up testing results.   No Charge.

## 2024-06-26 ENCOUNTER — Telehealth: Admitting: Physician Assistant

## 2024-06-26 DIAGNOSIS — B9689 Other specified bacterial agents as the cause of diseases classified elsewhere: Secondary | ICD-10-CM

## 2024-06-26 DIAGNOSIS — N760 Acute vaginitis: Secondary | ICD-10-CM

## 2024-06-26 MED ORDER — METRONIDAZOLE 500 MG PO TABS: 500.0000 mg | ORAL_TABLET | Freq: Two times a day (BID) | ORAL | 0 refills | Status: AC
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
## 2024-06-26 NOTE — Patient Instructions (Signed)
  Alexandra Stevenson, thank you for joining Harlene PEDLAR Ward, PA-C for today's virtual visit.  While this provider is not your primary care provider (PCP), if your PCP is located in our provider database this encounter information will be shared with them immediately following your visit.   A [facility] MyChart account gives you access to today's visit and all your visits, tests, and labs performed at [facility]  click here if you don't have a [facility] MyChart account or go to [email or web address]  Consent: (Patient) Alexandra Stevenson provided verbal consent for this virtual visit at the beginning of the encounter.  Current Medications:  Current Outpatient Medications:    metroNIDAZOLE  (FLAGYL ) 500 MG tablet, Take 1 tablet (500 mg total) by mouth 2 (two) times daily for 7 days., Disp: 14 tablet, Rfl: 0   Medications ordered in this encounter:  Meds ordered this encounter  Medications   metroNIDAZOLE  (FLAGYL ) 500 MG tablet    Sig: Take 1 tablet (500 mg total) by mouth 2 (two) times daily for 7 days.    Dispense:  14 tablet    Refill:  0    Supervising Provider:   BLAISE ALEENE KIDD [[id number]]     *If you need refills on other medications prior to your next appointment, please contact your pharmacy*  Follow-Up: Call back or seek an in-person evaluation if the symptoms worsen or if the condition fails to improve as anticipated.  [facility] [phone]  Other Instructions Take Flagyl  as prescribed.  If no improvement follow up for in person evaluation at [facility] Department or Urgent Care.    If you have been instructed to have an in-person evaluation today at a local Urgent Care facility, please use the link below. It will take you to a list of all of our available [facility], including address, phone number and hours of operation. Please do not delay care.  [facility]  If you or a family member do not have a primary care provider, use  the link below to schedule a visit and establish care. When you choose a [facility] primary care physician or advanced practice provider, you gain a long-term partner in health. Find a Primary Care Provider  Learn more about [facility] in-office and virtual care options: [facility] - Get Care Now
